# Patient Record
Sex: Female | Born: 1960 | Race: White | Hispanic: No | Marital: Married | Smoking: Current every day smoker
Health system: Southern US, Community
[De-identification: ages and names within clinical notes are randomized; demographics above are authoritative.]

## PROBLEM LIST (undated history)

## (undated) DIAGNOSIS — R188 Other ascites: Secondary | ICD-10-CM

## (undated) DIAGNOSIS — M47817 Spondylosis without myelopathy or radiculopathy, lumbosacral region: Secondary | ICD-10-CM

## (undated) DIAGNOSIS — R569 Unspecified convulsions: Secondary | ICD-10-CM

## (undated) DIAGNOSIS — K769 Liver disease, unspecified: Secondary | ICD-10-CM

## (undated) DIAGNOSIS — E039 Hypothyroidism, unspecified: Secondary | ICD-10-CM

## (undated) DIAGNOSIS — K7030 Alcoholic cirrhosis of liver without ascites: Secondary | ICD-10-CM

## (undated) DIAGNOSIS — E7800 Pure hypercholesterolemia, unspecified: Secondary | ICD-10-CM

## (undated) DIAGNOSIS — S329XXA Fracture of unspecified parts of lumbosacral spine and pelvis, initial encounter for closed fracture: Secondary | ICD-10-CM

## (undated) DIAGNOSIS — I10 Essential (primary) hypertension: Secondary | ICD-10-CM

## (undated) DIAGNOSIS — I499 Cardiac arrhythmia, unspecified: Secondary | ICD-10-CM

## (undated) DIAGNOSIS — F418 Other specified anxiety disorders: Secondary | ICD-10-CM

## (undated) DIAGNOSIS — M47812 Spondylosis without myelopathy or radiculopathy, cervical region: Secondary | ICD-10-CM

## (undated) DIAGNOSIS — S82892A Other fracture of left lower leg, initial encounter for closed fracture: Secondary | ICD-10-CM

## (undated) DIAGNOSIS — M1990 Unspecified osteoarthritis, unspecified site: Secondary | ICD-10-CM

## (undated) DIAGNOSIS — E559 Vitamin D deficiency, unspecified: Secondary | ICD-10-CM

## (undated) HISTORY — DX: Alcoholic cirrhosis of liver without ascites: K70.30

## (undated) HISTORY — DX: Other ascites: R18.8

## (undated) HISTORY — DX: Other fracture of left lower leg, initial encounter for closed fracture: S82.892A

## (undated) HISTORY — DX: Vitamin D deficiency, unspecified: E55.9

## (undated) HISTORY — PX: CERVICAL SPINE SURGERY: SHX589

## (undated) HISTORY — DX: Unspecified osteoarthritis, unspecified site: M19.90

## (undated) HISTORY — PX: GALLBLADDER SURGERY: SHX652

## (undated) HISTORY — DX: Hypothyroidism, unspecified: E03.9

## (undated) HISTORY — DX: Spondylosis without myelopathy or radiculopathy, cervical region: M47.812

## (undated) HISTORY — DX: Fracture of unspecified parts of lumbosacral spine and pelvis, initial encounter for closed fracture: S32.9XXA

## (undated) HISTORY — DX: Other specified anxiety disorders: F41.8

## (undated) HISTORY — PX: REPLACEMENT TOTAL KNEE: SUR1224

## (undated) HISTORY — DX: Cardiac arrhythmia, unspecified: I49.9

## (undated) HISTORY — DX: Spondylosis without myelopathy or radiculopathy, lumbosacral region: M47.817

## (undated) HISTORY — DX: Liver disease, unspecified: K76.9

## (undated) HISTORY — DX: Unspecified convulsions: R56.9

## (undated) HISTORY — PX: WRIST SURGERY: SHX841

## (undated) HISTORY — DX: Pure hypercholesterolemia, unspecified: E78.00

## (undated) HISTORY — DX: Essential (primary) hypertension: I10

---

## 2002-09-28 ENCOUNTER — Inpatient Hospital Stay (HOSPITAL_COMMUNITY): Admission: EM | Admit: 2002-09-28 | Discharge: 2002-09-30 | Payer: Self-pay | Admitting: Psychiatry

## 2006-05-14 ENCOUNTER — Encounter: Admission: RE | Admit: 2006-05-14 | Discharge: 2006-05-14 | Payer: Self-pay | Admitting: Neurosurgery

## 2006-06-12 ENCOUNTER — Inpatient Hospital Stay (HOSPITAL_COMMUNITY): Admission: AD | Admit: 2006-06-12 | Discharge: 2006-06-16 | Payer: Self-pay | Admitting: Neurosurgery

## 2007-04-22 HISTORY — PX: LUMBAR SPINE SURGERY: SHX701

## 2010-05-15 ENCOUNTER — Ambulatory Visit (HOSPITAL_COMMUNITY)
Admission: RE | Admit: 2010-05-15 | Discharge: 2010-05-15 | Payer: Self-pay | Source: Home / Self Care | Attending: Neurosurgery | Admitting: Neurosurgery

## 2010-05-15 LAB — CBC
HCT: 44 % (ref 36.0–46.0)
Hemoglobin: 15 g/dL (ref 12.0–15.0)
MCH: 36.7 pg — ABNORMAL HIGH (ref 26.0–34.0)
MCHC: 34.1 g/dL (ref 30.0–36.0)
MCV: 107.6 fL — ABNORMAL HIGH (ref 78.0–100.0)
Platelets: 162 10*3/uL (ref 150–400)
RBC: 4.09 MIL/uL (ref 3.87–5.11)
RDW: 12.3 % (ref 11.5–15.5)
WBC: 4.7 10*3/uL (ref 4.0–10.5)

## 2010-05-15 LAB — BASIC METABOLIC PANEL
BUN: 3 mg/dL — ABNORMAL LOW (ref 6–23)
CO2: 29 mEq/L (ref 19–32)
Calcium: 9.7 mg/dL (ref 8.4–10.5)
Chloride: 100 mEq/L (ref 96–112)
Creatinine, Ser: 0.49 mg/dL (ref 0.4–1.2)
GFR calc Af Amer: >60 mL/min (ref >60)
GFR calc non Af Amer: >60 mL/min (ref >60)
Glucose, Bld: 80 mg/dL (ref 70–99)
Potassium: 4.2 mEq/L (ref 3.5–5.1)
Sodium: 139 mEq/L (ref 135–145)

## 2010-05-15 LAB — SURGICAL PCR SCREEN
MRSA, PCR: NEGATIVE
Staphylococcus aureus: NEGATIVE

## 2010-05-24 ENCOUNTER — Inpatient Hospital Stay (HOSPITAL_COMMUNITY)
Admission: RE | Admit: 2010-05-24 | Discharge: 2010-05-26 | DRG: 473 | Disposition: A | Discharge status: Home / Self Care | Payer: Medicaid Other | Source: Ambulatory Visit | Attending: Neurosurgery | Admitting: Neurosurgery

## 2010-05-24 ENCOUNTER — Inpatient Hospital Stay (HOSPITAL_COMMUNITY): Payer: Medicaid Other

## 2010-05-24 DIAGNOSIS — E039 Hypothyroidism, unspecified: Secondary | ICD-10-CM | POA: Diagnosis present

## 2010-05-24 DIAGNOSIS — M5030 Other cervical disc degeneration, unspecified cervical region: Principal | ICD-10-CM | POA: Diagnosis present

## 2010-05-24 DIAGNOSIS — Z880 Allergy status to penicillin: Secondary | ICD-10-CM

## 2010-05-24 DIAGNOSIS — F17200 Nicotine dependence, unspecified, uncomplicated: Secondary | ICD-10-CM | POA: Diagnosis present

## 2010-05-24 DIAGNOSIS — I10 Essential (primary) hypertension: Secondary | ICD-10-CM | POA: Diagnosis present

## 2010-05-24 DIAGNOSIS — Z8673 Personal history of transient ischemic attack (TIA), and cerebral infarction without residual deficits: Secondary | ICD-10-CM

## 2010-06-04 NOTE — Op Note (Signed)
  NAMEANGELEA, Ann Wall             ACCOUNT NO.:  [id number]  MEDICAL RECORD NO.:  [id number]           PATIENT TYPE:  I  LOCATION:  3007                         FACILITY:  [facility]  PHYSICIAN:  Hilda Lias, M.D.   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  PROCEDURES:  Anterior 4-5, 5-6 diskectomy, decompression of the cervical spinal cord, bilateral foraminotomy, interbody fusion with auto and allograft, plate, microscope.  SURGEON:  Hilda Lias, MD  ASSISTANT:  Hewitt Shorts, MD  CLINICAL HISTORY:  The patient was admitted because of neck pain with radiation to both upper extremities that had been going on for several months.  The pain is worse on the left side than the right side.  She also has some pain in the lower back going to the lower extremity.  X- rays showed stenosis and spondylosis at the level of 4-5 and 5-6.  The patient knew about the risks with surgery and also knew that the surgery will not correct the lower lumbar pain.  PROCEDURE:  The patient was taken to the OR and after intubation the left side of the neck was cleaned with DuraPrep.  Transverse incision was made through the skin and subcutaneous tissue down to the cervical spine.  X-ray showed that we were at the level of 5-6.  From then on the anterior ligament of 4-5 and 5-6 was opened and we brought the microscope into the area.  Indeed the disk between 5-6 was quite narrow all the way to the posterior ligament.  The posterior ligament was calcified, opening was done in the midline, and decompression of spinal cord and foraminotomy was accomplished.  The same procedure was done at L4-5 with the same  findings.  From then on the endplate of 4-5 and 5-6 were removed and two piece of allograft with autograft inside, 7 mm height, lordotic were introduced followed by a plate using 6 screws. Lateral cervical spine showed good position of bone graft and the plate. From then on, the area was irrigated.  We waited 5 minutes just to be sure that we had good hemostasis.  Once this was accomplished, the wound was closed with Vicryl and Steri-Strips.          ______________________________ Hilda Lias, M.D.     EB/MEDQ  D:  05/24/2010  T:  05/25/2010  Job:  [id number]  Electronically Signed by Hilda Lias M.D. on 05/29/2010 09:41:17 AM

## 2010-09-06 NOTE — Op Note (Signed)
NAME:  Ann Wall, Ann Wall NO.:  [id number]   MEDICAL RECORD NO.:  [id number]          PATIENT TYPE:  INP   LOCATION:  3011                         FACILITY:  [facility]   PHYSICIAN:  Hilda Lias, M.D.   DATE OF BIRTH:  Sep 06, 1960   DATE OF PROCEDURE:  06/12/2006  DATE OF DISCHARGE:                               OPERATIVE REPORT   PREOPERATIVE DIAGNOSES:  1. L4-5 spondylolisthesis with chronic radiculopathy.  2. Left L3-4 extraforaminal herniated disk.   POSTOPERATIVE DIAGNOSES:  1. L4-L5 spondylolisthesis with chronic radiculopathy.  2. Left L3-L4 extraforaminal herniated disk.   PROCEDURE:  1. Gill L4 laminectomy and facetectomy.  2. Bilateral total diskectomy.  3. Interbody fusion with cages, 10 x 22.  4. Pedicle screws L4-L5.  5. Posterolateral arthrodesis L4-L5 with autograft and BMP.  6. L3-L4 extraforaminal foraminotomy to decompress the L3 nerve root.  7. Microscope.   SURGEON:  Hilda Lias, MD   CLINICAL HISTORY:  The patient is being seen in my office because of  chronic back pain.  The patient has failed conservative treatment.  X-  rays show spondylosis at the level of L4-L5 with stenosis.  Also showed  that she has extraforaminal disk of L3-L4 to the left.  Surgery was  advised and the risks were explained during the history and physical.   PROCEDURE IN DETAIL:  The patient was taken to the operating room and  after intubation for anesthesia was put on monitor.  The back was  cleaned with DuraPrep.  A midline incision from L3 to L4-5 was made.  Muscle was retracted laterally all the way until we were able to  __________  4, 5, and 3.  Retractor was inserted.  Then we proceeded  with the Gill procedure removing the spinous process of L4, the lamina,  and the facet.  The patient had quite a bit of scar tissue and lysis was  accomplished.  We had to drill out part of the medial aspect of the  canal.  Finally, we were able to reach into the disk.   An incision was  made to the disk and total gross diskectomy using a curette was done.  This procedure was done bilaterally.  Having done total diskectomy and  shaving the end plate, we introduced cages of 10 x 22 with BMP and  autograft inside.  The rest of the disk was filled up with the same BMP  and autograft.  Using the C-arm, first in an AP view and then a lateral  view, we probed the pedicles of L4-L5.  __________  were introduced and  __________  were introduced for screws.  At the level of L4-L5, the  screws were 5 x 45 and at the level of L5 were 5.5 x 40.  Investigation  __________  on the pedicle were normal with no perforation.  Nerve root  L4-L5 were completely intact.  Having done this, the pedicle screw was  secured in place using a rod with caps on top.  Then we went laterally  and __________ the periosteum of the transverse process of L4-L5, as  well as, the  right aspect of the facet.  This procedure was done  bilaterally.  A mix of BMP and autograft was done to do the arthrodesis.  Then with the microscope we identified L3-4 extraforaminal.  The  superior part of the facet laterally was drilled, as well as, the  proximal transverse process of the L3.  The intertransverse ligament was  also excised, as well as, the muscle.  We found the L3 nerve root.  It  was normal.  We investigated the foramen, it was found to be tight but  there was no evidence of any herniated disk.  Foraminotomy was  accomplished __________.  The disk was intact.  From then on, we  introduced a probe and there was plenty of space for the L3 nerve root.  Then the area was irrigated.  Fentanyl was left in the epidural space  and the wound was closed with Vicryl and Steri-Strip.           ______________________________  Hilda Lias, M.D.     EB/MEDQ  D:  06/12/2006  T:  06/13/2006  Job:  [id number]
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
## 2010-09-06 NOTE — Discharge Summary (Signed)
NAME:  Ann Wall, Ann Wall NO.:  [id number]   MEDICAL RECORD NO.:  [id number]                   PATIENT TYPE:  IPS   LOCATION:  0407                                 FACILITY:  [facility]   PHYSICIAN:  Jeanice Lim, M.D.              DATE OF BIRTH:  1961/02/23   DATE OF ADMISSION:  09/28/2002  DATE OF DISCHARGE:  09/30/2002                                 DISCHARGE SUMMARY   IDENTIFYING DATA:  This is a 50 year old single Caucasian female  involuntarily committed presenting with severe anxiety and alcohol abuse,  shaking and jerking at home with some thought-blocking, using alcohol and  cocaine.   MEDICATIONS:  Prozac and Ambien.   ALLERGIES:  PENICILLIN.   PHYSICAL EXAMINATION:  Essentially within normal limits.  Neurologically  nonfocal.   LABORATORY DATA:  Routine admission labs within normal limits except  prolonged QT at 44.  CT negative.  Urine pregnancy test negative.   MENTAL STATUS EXAM:  Awake, alert, cooperative female.  Soft-spoken.  Mood  somewhat bitter.  Affect flat.  Thought processes goal directed.  Thought  content negative for dangerous ideation or psychotic symptoms.  Cognitively  intact.  Judgment and insight poor.   ADMISSION DIAGNOSES:   AXIS I:  1. Psychosis not otherwise specified.  2. Alcohol abuse.  3. Cocaine abuse.   AXIS II:  Deferred.   AXIS III:  1. Cirrhosis per history.  2. Current urinary tract infection.   AXIS IV:  Moderate (problems with primary support group).   AXIS V:  25/60.   HOSPITAL COURSE:  The patient was admitted and ordered routine p.r.n.  medications and underwent further monitoring.  Was encouraged to  participated in individual, group and milieu therapy.  The patient reported  being very depressed and had a seizure prior to admission, as per patient.  History of liver cirrhosis, as per patient.  The patient denied any  withdrawal symptoms on day #2.  Reported no confusion.  Aware of the  danger  of drinking and using cocaine.  The patient did not require p.r.n.  medications for detox and had no acute risk issues.   CONDITION ON DISCHARGE:  She was discharged in improved condition.   DISCHARGE MEDICATIONS:  1. Prozac 20 mg, 2 q.a.m.  2. Keflex 500 mg t.i.d.  3. Librium 25 mg q.h.s. x 2 days.   FOLLOW UP:  The patient was to follow up with [facility] on October 05, 2002 at 9 a.m.   DISCHARGE DIAGNOSES:   AXIS I:  1. Psychosis not otherwise specified.  2. Alcohol abuse.  3. Cocaine abuse.   AXIS II:  Deferred.   AXIS III:  1. Cirrhosis per history.  2. Current urinary tract infection.   AXIS IV:  Moderate (problems with primary support group).   AXIS V:  Global Assessment of Functioning on discharge 55.  Jeanice Lim, M.D.    JEM/MEDQ  D:  11/02/2002  T:  11/03/2002  Job:  [id number]

## 2012-08-30 ENCOUNTER — Ambulatory Visit (INDEPENDENT_AMBULATORY_CARE_PROVIDER_SITE_OTHER): Payer: BC Managed Care – PPO | Admitting: Neurology

## 2012-08-30 ENCOUNTER — Encounter: Payer: Self-pay | Admitting: Neurology

## 2012-08-30 VITALS — BP 121/84 | HR 82 | Ht 62.5 in | Wt 122.0 lb

## 2012-08-30 DIAGNOSIS — M47812 Spondylosis without myelopathy or radiculopathy, cervical region: Secondary | ICD-10-CM

## 2012-08-30 DIAGNOSIS — M79609 Pain in unspecified limb: Secondary | ICD-10-CM

## 2012-08-30 DIAGNOSIS — M47817 Spondylosis without myelopathy or radiculopathy, lumbosacral region: Secondary | ICD-10-CM

## 2012-08-30 HISTORY — DX: Spondylosis without myelopathy or radiculopathy, cervical region: M47.812

## 2012-08-30 HISTORY — DX: Spondylosis without myelopathy or radiculopathy, lumbosacral region: M47.817

## 2012-08-30 NOTE — Progress Notes (Signed)
Reason for visit: Neck and back pain  Ann Wall is a 52 y.o. female  History of present illness:  Ann Wall is a 52 year old left-handed white female with a history of chronic neck and low back pain. The patient has had lumbar and cervical spine surgery in the past. The patient has been seen previously through this office in 2008 for neck and low back pain. The patient indicates that she has discomfort involving the neck, with some headaches coming up from the back of the neck that occurred 3 or 4 times a week. The patient denies any pain down into the shoulders or arms from the neck. The patient also has chronic low back pain, and pain radiating down from the back into the legs down to the feet bilaterally. The patient also experiences arthritis associated pain in both knees, and she indicates that she has a Baker's cyst in the left knee. The patient is to see an orthopedic surgeon within the next several days. The patient feels as if the legs are weak, and she has some alteration in balance. The patient uses a cane for ambulation. The patient denies problems controlling the bowels or the bladder. The patient has been on chronic daily narcotic use for several years, taking 4 to 7 oxycodone 30 mg tablets daily. The patient reports some numbness involving the right hand, but no numbness otherwise in the arms. The patient has had MRI evaluations of the cervical spine done recently in October 2013, and the study is brought for my review. This study shows a shallow disc protrusion at the C6-7 level, but no evidence of nerve root impingement is seen. The patient has had prior surgery at the C4-C6 level. MRI of the lumbosacral spine has been done previously showing a chronic vertebral body fracture at L3 level, with some bone marrow edema. Otherwise, no significant neuroforaminal stenosis or spinal stenosis is seen at any level. MRI of the brain has been done as well in June of 2012, and this revealed  mild small vessel disease. The patient is sent to this office for an evaluation.  Past Medical History  Diagnosis Date  . Hypertension   . High cholesterol   . Depression with anxiety   . Cervical spondylosis without myelopathy 08/30/2012  . Lumbosacral spondylosis without myelopathy 08/30/2012  . Vitamin D deficiency   . Hypothyroidism   . Degenerative arthritis   . Pelvic fracture     Bilateral  . Ankle fracture, left     Past Surgical History  Procedure Laterality Date  . Lumbar spine surgery  2009  . Cervical spine surgery    . Cesarean section  [phone]  . Wrist surgery Right     Family History  Problem Relation Age of Onset  . Hypertension Mother   . High Cholesterol Mother   . Hypertension Father   . Diabetes Father   . High Cholesterol Father   . Stroke Father   . Cancer Maternal Aunt     Breast cancer  . Hypertension Paternal Aunt   . Stroke Paternal Aunt   . Hypertension Paternal Uncle   . Stroke Paternal Uncle     Social history:  reports that she has been smoking Cigarettes.  She has been smoking about 0.50 packs per day. She does not have any smokeless tobacco history on file. She reports that she does not drink alcohol or use illicit drugs.  Medications:  No current outpatient prescriptions on file prior to visit.  No current facility-administered medications on file prior to visit.    Allergies:  Allergies  Allergen Reactions  . Penicillins     ROS:  Out of a complete 14 system review of symptoms, the patient complains only of the following symptoms, and all other reviewed systems are negative.  Fevers, chills, fatigue Palpitations of the heart, swelling of the legs Dizziness, difficulty swallowing Moles Blurred vision, eye pain Shortness of breath, cough, wheezing, snoring Constipation Easy bruising easy bleeding Joint pain, joint swelling, muscle cramps Allergies, runny nose Memory loss, confusion, headache, numbness, slurred  speech, difficulty swallowing, blackout episodes Depression, anxiety, difficulty sleeping, change in appetite, disinterest in activities, racing thoughts  Blood pressure 121/84, pulse 82, height 5' 2.5" (1.588 m), weight 122 lb (55.339 kg).  Physical Exam  General: The patient is alert and cooperative at the time of the examination.  Head: Pupils are equal, round, and reactive to light. Discs are flat bilaterally.  Neck: The neck is supple, no carotid bruits are noted.  Respiratory: The respiratory examination is clear, with the exception of very occasional wheezes posteriorly.  Cardiovascular: The cardiovascular examination reveals a regular rate and rhythm, no obvious murmurs or rubs are noted.  Skin: Extremities are without significant edema.  Neurologic Exam  Mental status:  Cranial nerves: Facial symmetry is present. There is good sensation of the face to pinprick and soft touch bilaterally. The strength of the facial muscles and the muscles to head turning and shoulder shrug are normal bilaterally. Speech is well enunciated, no aphasia or dysarthria is noted. Extraocular movements are full. Visual fields are full.  Motor: The motor testing reveals 5 over 5 strength of the upper extremities. With the lower extremities, the patient has diffuse giveaway weakness throughout. Good symmetric motor tone is noted throughout.  Sensory: Sensory testing is notable for variable pinprick sensation testing in the arms and legs. The patient appears to have decreased position and vibration sensation in the lower extremities, but no stocking pattern pinprick sensory deficit is seen. Position sensation and vibration sensation in arms is normal. Evidence of extinction is noted on the right.  Coordination: Cerebellar testing reveals good finger-nose-finger and heel-to-shin bilaterally.  Gait and station: Gait is slow, limping in quality. The patient uses a cane for ambulation. Tandem gait was  unsteady. Romberg is negative. No drift is seen.  Reflexes: Deep tendon reflexes are symmetric, but are depressed bilaterally. Toes are downgoing bilaterally.   Assessment/Plan:  1. Chronic neck discomfort  2. Chronic low back pain  3. Degenerative arthritis  The patient presents with a chronic pain syndrome, and she is on daily narcotic medications. In the past, she has been on Lyrica without benefit. The patient likely could use the benefit of a pain center referral for possible injections and the consideration for a spinal stimulator. The patient will be set up for nerve conduction studies of both arms and legs, and EMG evaluation of the right arm and right leg. Depending on the results of this study, further blood work may be done, and a referral to a pain center may be made. The patient will followup for the EMG evaluation.  Marlan Palau MD 08/30/2012 8:23 PM  [facility] [address] [place], [place] [address]  Phone [phone] Fax [phone]

## 2012-09-15 ENCOUNTER — Encounter: Payer: Self-pay | Admitting: Neurology

## 2012-09-22 ENCOUNTER — Encounter: Payer: BC Managed Care – PPO | Admitting: Neurology

## 2012-09-28 ENCOUNTER — Encounter: Payer: BC Managed Care – PPO | Admitting: Neurology

## 2012-10-14 ENCOUNTER — Telehealth: Payer: Self-pay | Admitting: Neurology

## 2012-10-19 ENCOUNTER — Encounter: Payer: BC Managed Care – PPO | Admitting: Neurology

## 2012-11-15 ENCOUNTER — Encounter (INDEPENDENT_AMBULATORY_CARE_PROVIDER_SITE_OTHER): Payer: BC Managed Care – PPO

## 2012-11-15 ENCOUNTER — Telehealth: Payer: Self-pay | Admitting: Neurology

## 2012-11-15 ENCOUNTER — Ambulatory Visit (INDEPENDENT_AMBULATORY_CARE_PROVIDER_SITE_OTHER): Payer: BC Managed Care – PPO | Admitting: Neurology

## 2012-11-15 DIAGNOSIS — M47812 Spondylosis without myelopathy or radiculopathy, cervical region: Secondary | ICD-10-CM

## 2012-11-15 DIAGNOSIS — M47817 Spondylosis without myelopathy or radiculopathy, lumbosacral region: Secondary | ICD-10-CM

## 2012-11-15 DIAGNOSIS — M79609 Pain in unspecified limb: Secondary | ICD-10-CM

## 2012-11-15 DIAGNOSIS — Z0289 Encounter for other administrative examinations: Secondary | ICD-10-CM

## 2012-11-15 NOTE — Progress Notes (Signed)
Ann Wall comes in today for EMG and nerve conduction study evaluation. The study was unremarkable. The patient continues to have ongoing chronic pain, and she indicates that she is getting pain medications through her primary care physician. The patient is not interested in a referral to a pain Center, and she is not interested in considering a spinal stimulator for her chronic pain. There is no evidence of nerve root injury, but the pain likely is coming from arthritic changes, and muscle spasm. At this point, the patient will followup through this office if needed.

## 2012-11-15 NOTE — Procedures (Signed)
HISTORY:  Ann Wall is a 52 year old patient with a history of chronic neck and low back pain. The patient has had prior lumbosacral and cervical spine surgery, and she reports ongoing problems with cervicogenic headache and chronic low back pain with pain coming from the back down to the feet bilaterally. The patient is being evaluated for the discomfort.  NERVE CONDUCTION STUDIES:  Nerve conduction studies were performed on both upper extremities. The distal motor latencies and motor amplitudes for the median and ulnar nerves were within normal limits. The F wave latencies and nerve conduction velocities for these nerves were also normal. The sensory latencies for the median and ulnar nerves were normal.  Nerve conduction studies were performed on both lower extremities. The distal motor latencies and motor amplitudes for the peroneal and posterior tibial nerves were within normal limits. The nerve conduction velocities for these nerves were also normal. The H reflex latencies were normal. The sensory latencies for the peroneal nerves were within normal limits.   EMG STUDIES:  EMG study was performed on the right upper extremity:  The first dorsal interosseous muscle reveals 2 to 4 K units with full recruitment. No fibrillations or positive waves were noted. The abductor pollicis brevis muscle reveals 2 to 4 K units with full recruitment. No fibrillations or positive waves were noted. The extensor indicis proprius muscle reveals 1 to 3 K units with full recruitment. No fibrillations or positive waves were noted. The pronator teres muscle reveals 2 to 3 K units with full recruitment. No fibrillations or positive waves were noted. The biceps muscle reveals 1 to 2 K units with full recruitment. No fibrillations or positive waves were noted. The triceps muscle reveals 2 to 4 K units with full recruitment. No fibrillations or positive waves were noted. The anterior deltoid muscle reveals 2  to 3 K units with full recruitment. No fibrillations or positive waves were noted. The cervical paraspinal muscles were tested at 2 levels. No abnormalities of insertional activity were seen at either level tested. There was poor relaxation.  EMG study was performed on the right lower extremity:  The tibialis anterior muscle reveals 2 to 3K motor units with full recruitment. No fibrillations or positive waves were seen. The peroneus tertius muscle reveals 2 to 3K motor units with full recruitment. No fibrillations or positive waves were seen. The medial gastrocnemius muscle reveals 1 to 3K motor units with full recruitment. No fibrillations or positive waves were seen. The vastus lateralis muscle reveals 2 to 3K motor units with full recruitment. No fibrillations or positive waves were seen. The iliopsoas muscle reveals 2 to 4K motor units with full recruitment. No fibrillations or positive waves were seen. The biceps femoris muscle (long head) reveals 2 to 4K motor units with full recruitment. No fibrillations or positive waves were seen. The lumbosacral paraspinal muscles were tested at 3 levels, and revealed no abnormalities of insertional activity at the upper and middle levels tested. 2+ fibrillations and positive waves were seen in the lower level. There was good relaxation.   IMPRESSION:  Nerve conduction studies done on all 4 extremities were within normal limits. No evidence of a neuropathy is seen. EMG evaluation of the right upper extremity was unremarkable, as was the EMG evaluation of the right lower extremity. There is no evidence of a right sided cervical or a lumbosacral radiculopathy.  Marlan Palau MD 11/15/2012 1:54 PM  [facility] [address] [place], [place] [address]  Phone [phone]  Fax [phone]

## 2012-11-15 NOTE — Telephone Encounter (Signed)
I called the patient. The patient went information regarding a spinal stimulator. I have recommended a pain Center referral for this reason. The patient will contact our office if she wants to have a referral.

## 2013-06-29 NOTE — Telephone Encounter (Signed)
Pt came in for her visit closing encounter °

## 2017-08-07 DIAGNOSIS — T424X5A Adverse effect of benzodiazepines, initial encounter: Secondary | ICD-10-CM

## 2017-08-07 DIAGNOSIS — F419 Anxiety disorder, unspecified: Secondary | ICD-10-CM

## 2017-08-07 DIAGNOSIS — E876 Hypokalemia: Secondary | ICD-10-CM

## 2017-08-07 DIAGNOSIS — R569 Unspecified convulsions: Secondary | ICD-10-CM

## 2017-08-07 DIAGNOSIS — E860 Dehydration: Secondary | ICD-10-CM

## 2017-08-07 DIAGNOSIS — F17210 Nicotine dependence, cigarettes, uncomplicated: Secondary | ICD-10-CM

## 2017-08-07 DIAGNOSIS — G92 Toxic encephalopathy: Secondary | ICD-10-CM

## 2017-08-07 DIAGNOSIS — E039 Hypothyroidism, unspecified: Secondary | ICD-10-CM

## 2017-08-07 DIAGNOSIS — T40605A Adverse effect of unspecified narcotics, initial encounter: Secondary | ICD-10-CM

## 2017-08-08 DIAGNOSIS — T424X5A Adverse effect of benzodiazepines, initial encounter: Secondary | ICD-10-CM | POA: Diagnosis not present

## 2017-08-08 DIAGNOSIS — T40605A Adverse effect of unspecified narcotics, initial encounter: Secondary | ICD-10-CM | POA: Diagnosis not present

## 2017-08-08 DIAGNOSIS — R569 Unspecified convulsions: Secondary | ICD-10-CM | POA: Diagnosis not present

## 2017-08-08 DIAGNOSIS — G92 Toxic encephalopathy: Secondary | ICD-10-CM | POA: Diagnosis not present

## 2017-08-28 DIAGNOSIS — K7031 Alcoholic cirrhosis of liver with ascites: Secondary | ICD-10-CM | POA: Diagnosis not present

## 2017-08-28 DIAGNOSIS — R569 Unspecified convulsions: Secondary | ICD-10-CM | POA: Diagnosis not present

## 2017-08-29 DIAGNOSIS — R569 Unspecified convulsions: Secondary | ICD-10-CM

## 2017-08-29 DIAGNOSIS — K7031 Alcoholic cirrhosis of liver with ascites: Secondary | ICD-10-CM

## 2017-08-30 DIAGNOSIS — R569 Unspecified convulsions: Secondary | ICD-10-CM | POA: Diagnosis not present

## 2017-08-30 DIAGNOSIS — K7031 Alcoholic cirrhosis of liver with ascites: Secondary | ICD-10-CM | POA: Diagnosis not present

## 2018-03-31 DIAGNOSIS — E440 Moderate protein-calorie malnutrition: Secondary | ICD-10-CM

## 2018-03-31 DIAGNOSIS — K7460 Unspecified cirrhosis of liver: Secondary | ICD-10-CM

## 2018-03-31 DIAGNOSIS — G92 Toxic encephalopathy: Secondary | ICD-10-CM

## 2018-03-31 DIAGNOSIS — F19931 Other psychoactive substance use, unspecified with withdrawal delirium: Secondary | ICD-10-CM | POA: Diagnosis not present

## 2018-03-31 DIAGNOSIS — G9341 Metabolic encephalopathy: Secondary | ICD-10-CM | POA: Diagnosis not present

## 2018-03-31 DIAGNOSIS — R4182 Altered mental status, unspecified: Secondary | ICD-10-CM | POA: Diagnosis not present

## 2018-03-31 DIAGNOSIS — N3000 Acute cystitis without hematuria: Secondary | ICD-10-CM

## 2018-03-31 DIAGNOSIS — F19188 Other psychoactive substance abuse with other psychoactive substance-induced disorder: Secondary | ICD-10-CM | POA: Diagnosis not present

## 2018-03-31 DIAGNOSIS — I10 Essential (primary) hypertension: Secondary | ICD-10-CM

## 2018-04-01 DIAGNOSIS — F19931 Other psychoactive substance use, unspecified with withdrawal delirium: Secondary | ICD-10-CM | POA: Diagnosis not present

## 2018-04-01 DIAGNOSIS — F19188 Other psychoactive substance abuse with other psychoactive substance-induced disorder: Secondary | ICD-10-CM | POA: Diagnosis not present

## 2018-04-01 DIAGNOSIS — G9341 Metabolic encephalopathy: Secondary | ICD-10-CM | POA: Diagnosis not present

## 2018-04-01 DIAGNOSIS — R4182 Altered mental status, unspecified: Secondary | ICD-10-CM | POA: Diagnosis not present

## 2018-04-02 DIAGNOSIS — R4182 Altered mental status, unspecified: Secondary | ICD-10-CM | POA: Diagnosis not present

## 2018-04-02 DIAGNOSIS — F19188 Other psychoactive substance abuse with other psychoactive substance-induced disorder: Secondary | ICD-10-CM | POA: Diagnosis not present

## 2018-04-02 DIAGNOSIS — G9341 Metabolic encephalopathy: Secondary | ICD-10-CM | POA: Diagnosis not present

## 2018-04-02 DIAGNOSIS — F19931 Other psychoactive substance use, unspecified with withdrawal delirium: Secondary | ICD-10-CM | POA: Diagnosis not present

## 2019-05-09 DIAGNOSIS — G40909 Epilepsy, unspecified, not intractable, without status epilepticus: Secondary | ICD-10-CM

## 2019-05-09 DIAGNOSIS — G9340 Encephalopathy, unspecified: Secondary | ICD-10-CM | POA: Diagnosis not present

## 2019-05-10 DIAGNOSIS — G9340 Encephalopathy, unspecified: Secondary | ICD-10-CM | POA: Diagnosis not present

## 2019-05-11 DIAGNOSIS — G9340 Encephalopathy, unspecified: Secondary | ICD-10-CM | POA: Diagnosis not present

## 2019-05-12 DIAGNOSIS — G9340 Encephalopathy, unspecified: Secondary | ICD-10-CM | POA: Diagnosis not present

## 2019-05-13 DIAGNOSIS — G9340 Encephalopathy, unspecified: Secondary | ICD-10-CM | POA: Diagnosis not present

## 2019-05-14 DIAGNOSIS — G9340 Encephalopathy, unspecified: Secondary | ICD-10-CM | POA: Diagnosis not present

## 2019-06-28 ENCOUNTER — Encounter: Payer: Self-pay | Admitting: Gastroenterology

## 2019-08-01 ENCOUNTER — Ambulatory Visit: Payer: Medicaid Other | Admitting: Gastroenterology

## 2019-08-25 ENCOUNTER — Ambulatory Visit: Payer: Medicaid Other | Admitting: Gastroenterology

## 2019-08-25 ENCOUNTER — Other Ambulatory Visit: Payer: Self-pay

## 2019-08-25 ENCOUNTER — Encounter: Payer: Self-pay | Admitting: Gastroenterology

## 2019-08-25 VITALS — BP 98/60 | HR 70 | Temp 97.5°F | Ht 63.5 in | Wt 96.0 lb

## 2019-08-25 DIAGNOSIS — R109 Unspecified abdominal pain: Secondary | ICD-10-CM | POA: Diagnosis not present

## 2019-08-25 DIAGNOSIS — R188 Other ascites: Secondary | ICD-10-CM | POA: Diagnosis not present

## 2019-08-25 DIAGNOSIS — K7460 Unspecified cirrhosis of liver: Secondary | ICD-10-CM

## 2019-08-25 MED ORDER — SPIRONOLACTONE 50 MG PO TABS: 50.0000 mg | ORAL_TABLET | Freq: Every day | ORAL | 6 refills | Status: DC

## 2019-08-25 MED ORDER — LACTULOSE 10 GM/15ML PO SOLN: ORAL | 0 refills | Status: DC

## 2019-08-25 NOTE — Patient Instructions (Signed)
You have been scheduled for an abdominal paracentesis at  [facility] radiology (1st floor of hospital) on 08/29/19 at 10am. Please arrive at least 15 minutes prior to your appointment time for registration. Should you need to reschedule this appointment for any reason, please call our office at [phone].  Your provider has requested that you go to the basement level for lab work at [address] [place] in [place] [address]. Press "B" on the elevator. The lab is located at the first door on the left as you exit the elevator.  Due to recent changes in healthcare laws, you may see the results of your imaging and laboratory studies on MyChart before your provider has had a chance to review them.  We understand that in some cases there may be results that are confusing or concerning to you. Not all laboratory results come back in the same time frame and the provider may be waiting for multiple results in order to interpret others.  Please give [place] 48 hours in order for your provider to thoroughly review all the results before contacting the office for clarification of your results.   We have sent the following medications to your pharmacy for you to pick up at your convenience:   Thank you,  Dr. Lynann Bologna

## 2019-08-25 NOTE — Progress Notes (Addendum)
Chief Complaint: ascites  Referring Provider:  Bonnita Nasuti, MD      ASSESSMENT AND PLAN;   .#1. 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 Was being followed by Dr Melina Copa  #2.  Subclinical hepatic encephalopathy -lactulose 10 g per 15 mL.  1 tablespoon p.o. twice daily.  Titrate to 2-3 bowel movements per day.  #3.  Massive ascites -LVP. Send fluid for cytology, cell count, alb, culture -Check CBC, CMP, PT INR, AFP (monitor for electrolyte abnormalities and ARF) -Lasix 40 mg p.o. QD to continue.  Will gradually increase diuretics -Increased Spironolactone 50 mg p.o. QD -Salt restricted diet.  2g Na, normal protein diet.  #5. SZ D/O  #6. [place] screening  -US abdo annually. -Patient is not a candidate for liver transplantation d/t multiple comorbid conditions. -Discussed extensively with pt and pt's husband. They do understand poor long-term prognosis. -Avoid NSAIDs. -Obtain records from Dr. Carmie End office and [facility] regarding previous work-up. -FU in 6 weeks.  Addendum-we got notes from Dr Haque-significant for pos HBsAg (needs to be addressed at follow-up visit), neg HCV, HbcAb IgM. HAV IgM From 07/19/2019: Hb 13.3, MCV 84, WBC count 7.2, platelets 318. HBA1c 5.1 Iron studies were negative with ferritin 31 LFTs Albumin 3.2, alk phos 239, ALT 22, AST 45, creatinine 0.66. GGT 147, potassium 3.5, LDH 225, sodium 140, total bilirubin 1.0, magnesium 1.9.  Tox screen was positive for barbiturates and benzodiazepines. NH3 91 Rest of the labs, CT, discharge summary from [facility] awaited. HPI:    Ann Wall is a 59 y.o. female  First visit-seen as emergency workin.  Accompanied by her husband.  Records awaited. Very unfortunate patient With progressive abdominal distention-now has massive ascites And gained 10 pounds over the last 2 to 3 months. Has leg swelling as well.  Was been followed previously by Dr. Melina Copa.  Now  wants to be seen here.  Started on Lasix 40 mg p.o. once a day and spironolactone 25 mg p.o. once a day.  She continues to drink significant fluid.  She was admitted to [facility] approximately 3 months ago.  Had large-volume paracenteses.  Also had CT Abdo/pelvis.  We do not have records currently.  No nausea, vomiting, heartburn, regurgitation, odynophagia or dysphagia.  No significant diarrhea or constipation.  No melena or hematochezia.   She has history of alcohol abuse.  But none over the last 3 years.  No H/O itching, skin lesions, easy bruisability, intake of OTC meds including diet pills, herbal medications, anabolic steroids or Tylenol. There is no H/O blood transfusions, IVDA or FH of liver disease. No jaundice, dark urine or pale stools.   Had previous work-up with Dr. Melina Copa     Past Medical History:  Diagnosis Date  . Alcoholic cirrhosis ([place])   . Ankle fracture, left   . Arrhythmia   . Ascites   . Cervical spondylosis without myelopathy 08/30/2012  . Chronic liver disease   . Degenerative arthritis   . Depression with anxiety   . High cholesterol   . Hypertension   . Hypothyroidism   . Lumbosacral spondylosis without myelopathy 08/30/2012  . Pelvic fracture ([facility])    Bilateral  . Seizure ([place])   . Vitamin D deficiency     Past Surgical History:  Procedure Laterality Date  . CERVICAL SPINE SURGERY    . CESAREAN SECTION  [phone]  . GALLBLADDER SURGERY    . LUMBAR SPINE SURGERY  2009  .  REPLACEMENT TOTAL KNEE     x2  . WRIST SURGERY Right     Family History  Problem Relation Age of Onset  . Hypertension Mother   . High Cholesterol Mother   . Hypertension Father   . Diabetes Father   . High Cholesterol Father   . Stroke Father   . Colon polyps Father   . Cancer Maternal Aunt        Breast cancer  . Hypertension Paternal Aunt   . Stroke Paternal Aunt   . Hypertension Paternal Uncle   . Stroke Paternal Uncle   . Colon cancer Neg Hx   . Esophageal  cancer Neg Hx     Social History   Tobacco Use  . Smoking status: Current Every Day Smoker    Packs/day: 0.50    Types: Cigarettes  . Smokeless tobacco: Never Used  Substance Use Topics  . Alcohol use: No    Comment: quit 3 years ago   . Drug use: No    Current Outpatient Medications  Medication Sig Dispense Refill  . furosemide (LASIX) 40 MG tablet Take 40 mg by mouth daily.    Marland Kitchen levETIRAcetam (KEPPRA) 750 MG tablet Take 750 mg by mouth 2 (two) times daily.    Marland Kitchen levothyroxine (SYNTHROID, LEVOTHROID) 25 MCG tablet Take 25 mcg by mouth daily before breakfast.    . metoprolol (LOPRESSOR) 50 MG tablet Take 50 mg by mouth 2 (two) times daily.    Marland Kitchen oxyCODONE (ROXICODONE) 15 MG immediate release tablet Take 15 mg by mouth QID.    Marland Kitchen POTASSIUM PO Take 1 tablet by mouth 2 (two) times daily.    . primidone (MYSOLINE) 50 MG tablet Take 100 mg by mouth daily.    Marland Kitchen spironolactone (ALDACTONE) 25 MG tablet Take 25 mg by mouth daily.    Marland Kitchen ALPRAZolam (XANAX) 1 MG tablet Take 1 mg by mouth 4 (four) times daily.    . cloNIDine (CATAPRES) 0.1 MG tablet Take 0.1 mg by mouth 2 (two) times daily.    . cyclobenzaprine (FLEXERIL) 10 MG tablet Take 10 mg by mouth 2 (two) times daily as needed for muscle spasms.      No current facility-administered medications for this visit.    Allergies  Allergen Reactions  . Penicillins     Review of Systems:  Constitutional: Denies fever, chills, diaphoresis, appetite change and fatigue.  HEENT: Denies photophobia, eye pain, redness, hearing loss, ear pain, congestion, sore throat, rhinorrhea, sneezing, mouth sores, neck pain, neck stiffness and tinnitus.   Respiratory: Denies SOB, DOE, cough, chest tightness,  and wheezing.   Cardiovascular: Denies chest pain, palpitations and leg swelling.  Genitourinary: Denies dysuria, urgency, frequency, hematuria, flank pain and difficulty urinating.  Musculoskeletal: Has myalgias, back pain, joint swelling, arthralgias  and gait problem.  Skin: No rash.  Neurological: Denies dizziness, seizures, syncope, weakness, light-headedness, numbness and headaches.  Hematological: Denies adenopathy. Easy bruising, personal or family bleeding history  Psychiatric/Behavioral: Has anxiety or depression     Physical Exam:    BP 98/60   Pulse 70   Temp (!) 97.5 F (36.4 C)   Ht 5' 3.5" (1.613 m)   Wt 96 lb (43.5 kg)   BMI 16.74 kg/m  Wt Readings from Last 3 Encounters:  08/25/19 96 lb (43.5 kg)  08/30/12 122 lb (55.3 kg)   Constitutional: Cachectic, in no acute distress. Psychiatric: Normal mood and affect. Behavior is normal. HEENT: Pupils normal.  Conjunctivae are normal. No scleral icterus.  Neck supple.  Cardiovascular: Normal rate, regular rhythm. No edema Pulmonary/chest: Bilateral decreased breath sounds, no wheezing, rales or rhonchi. Abdominal: Tense massive ascites with prominent abdominal veins.  Bowel sounds active throughout. There are no masses palpable. No hepatomegaly. Rectal:  defered Neurological: Alert and oriented to person place and time. Skin: Skin is warm and dry. No rashes noted.  Extremities with 2+ edema  Data Reviewed: I have personally reviewed following labs and imaging studies  CBC: CBC Latest Ref Rng & Units 05/15/2010  WBC 4.0 - 10.5 K/uL 4.7  Hemoglobin 12.0 - 15.0 g/dL 15.0  Hematocrit 36.0 - 46.0 % 44.0  Platelets 150 - 400 K/uL 162    CMP: CMP Latest Ref Rng & Units 05/15/2010  Glucose 70 - 99 mg/dL 80  BUN 6 - 23 mg/dL 3(L)  Creatinine 0.4 - 1.2 mg/dL 0.49  Sodium 135 - 145 mEq/L 139  Potassium 3.5 - 5.1 mEq/L 4.2  Chloride 96 - 112 mEq/L 100  CO2 19 - 32 mEq/L 29  Calcium 8.4 - 10.5 mg/dL 9.7     Carmell Austria, MD 08/25/2019, 10:38 AM  Cc: Bonnita Nasuti, MD

## 2019-08-29 ENCOUNTER — Other Ambulatory Visit: Payer: Self-pay

## 2019-08-29 ENCOUNTER — Ambulatory Visit (HOSPITAL_COMMUNITY)
Admission: RE | Admit: 2019-08-29 | Discharge: 2019-08-29 | Disposition: A | Discharge status: Home / Self Care | Payer: Medicaid Other | Source: Ambulatory Visit | Attending: Gastroenterology | Admitting: Gastroenterology

## 2019-08-29 DIAGNOSIS — R109 Unspecified abdominal pain: Secondary | ICD-10-CM

## 2019-08-29 DIAGNOSIS — R188 Other ascites: Secondary | ICD-10-CM

## 2019-08-29 DIAGNOSIS — K7460 Unspecified cirrhosis of liver: Secondary | ICD-10-CM | POA: Diagnosis not present

## 2019-08-29 HISTORY — PX: IR PARACENTESIS: IMG2679

## 2019-08-29 LAB — GRAM STAIN

## 2019-08-29 LAB — BODY FLUID CELL COUNT WITH DIFFERENTIAL
Eos, Fluid: 0 %
Lymphs, Fluid: 49 %
Monocyte-Macrophage-Serous Fluid: 31 % — ABNORMAL LOW (ref 50–90)
Neutrophil Count, Fluid: 20 % (ref 0–25)
Total Nucleated Cell Count, Fluid: 145 cu mm (ref 0–1000)

## 2019-08-29 LAB — ALBUMIN, PLEURAL OR PERITONEAL FLUID: Albumin, Fluid: <1 g/dL

## 2019-08-29 MED ORDER — LIDOCAINE HCL 1 % IJ SOLN
INTRAMUSCULAR | Status: AC
  Filled 2019-08-29: qty 20

## 2019-08-29 MED ORDER — ALBUMIN HUMAN 25 % IV SOLN
INTRAVENOUS | Status: AC
  Filled 2019-08-29: qty 200

## 2019-08-29 MED ORDER — LIDOCAINE HCL (PF) 1 % IJ SOLN
INTRAMUSCULAR | Status: AC | PRN
  Administered 2019-08-29: 10 mL

## 2019-08-29 MED ORDER — ALBUMIN HUMAN 25 % IV SOLN
50.0000 g | Freq: Once | INTRAVENOUS | Status: DC
  Filled 2019-08-29: qty 200

## 2019-08-29 NOTE — Progress Notes (Signed)
VAST consulted to obtain PIV for albumin administration. Attempted once before pt decided she did not want to be stuck anymore. Offered to use ultrasound, but she refused.

## 2019-08-29 NOTE — Procedures (Signed)
Ultrasound-guided diagnostic and therapeutic paracentesis performed yielding 5.3 liters of pale yelloe colored fluid.  Fluid was sent to lab for analysis. No immediate complications. EBL is none.

## 2019-08-30 LAB — CYTOLOGY - NON PAP

## 2019-09-03 LAB — CULTURE, BODY FLUID W GRAM STAIN -BOTTLE: Culture: NO GROWTH

## 2019-09-06 ENCOUNTER — Other Ambulatory Visit (INDEPENDENT_AMBULATORY_CARE_PROVIDER_SITE_OTHER): Payer: Medicaid Other

## 2019-09-06 DIAGNOSIS — K7460 Unspecified cirrhosis of liver: Secondary | ICD-10-CM

## 2019-09-06 DIAGNOSIS — R109 Unspecified abdominal pain: Secondary | ICD-10-CM

## 2019-09-06 DIAGNOSIS — R188 Other ascites: Secondary | ICD-10-CM

## 2019-09-06 LAB — CBC WITH DIFFERENTIAL/PLATELET
Basophils Absolute: 0.1 10*3/uL (ref 0.0–0.1)
Basophils Relative: 1.5 % (ref 0.0–3.0)
Eosinophils Absolute: 0.1 10*3/uL (ref 0.0–0.7)
Eosinophils Relative: 1.2 % (ref 0.0–5.0)
HCT: 37.3 % (ref 36.0–46.0)
Hemoglobin: 12.5 g/dL (ref 12.0–15.0)
Lymphocytes Relative: 21.9 % (ref 12.0–46.0)
Lymphs Abs: 1.1 10*3/uL (ref 0.7–4.0)
MCHC: 33.6 g/dL (ref 30.0–36.0)
MCV: 80.8 fl (ref 78.0–100.0)
Monocytes Absolute: 0.7 10*3/uL (ref 0.1–1.0)
Monocytes Relative: 13.4 % — ABNORMAL HIGH (ref 3.0–12.0)
Neutro Abs: 3.3 10*3/uL (ref 1.4–7.7)
Neutrophils Relative %: 62 % (ref 43.0–77.0)
Platelets: 293 10*3/uL (ref 150.0–400.0)
RBC: 4.62 Mil/uL (ref 3.87–5.11)
RDW: 17.8 % — ABNORMAL HIGH (ref 11.5–15.5)
WBC: 5.3 10*3/uL (ref 4.0–10.5)

## 2019-09-06 LAB — COMPREHENSIVE METABOLIC PANEL
ALT: 11 U/L (ref 0–35)
AST: 26 U/L (ref 0–37)
Albumin: 3.5 g/dL (ref 3.5–5.2)
Alkaline Phosphatase: 260 U/L — ABNORMAL HIGH (ref 39–117)
BUN: 8 mg/dL (ref 6–23)
CO2: 34 mEq/L — ABNORMAL HIGH (ref 19–32)
Calcium: 8.6 mg/dL (ref 8.4–10.5)
Chloride: 92 mEq/L — ABNORMAL LOW (ref 96–112)
Creatinine, Ser: 0.49 mg/dL (ref 0.40–1.20)
GFR: 129.13 mL/min (ref >60.00)
Glucose, Bld: 124 mg/dL — ABNORMAL HIGH (ref 70–99)
Potassium: 3.6 mEq/L (ref 3.5–5.1)
Sodium: 133 mEq/L — ABNORMAL LOW (ref 135–145)
Total Bilirubin: 0.9 mg/dL (ref 0.2–1.2)
Total Protein: 6.4 g/dL (ref 6.0–8.3)

## 2019-09-06 LAB — PROTIME-INR
INR: 1.2 ratio — ABNORMAL HIGH (ref 0.8–1.0)
Prothrombin Time: 13.4 s — ABNORMAL HIGH (ref 9.6–13.1)

## 2019-09-07 LAB — AFP TUMOR MARKER: AFP-Tumor Marker: 3.6 ng/mL

## 2019-09-08 ENCOUNTER — Encounter: Payer: Self-pay | Admitting: Gastroenterology

## 2019-09-08 ENCOUNTER — Other Ambulatory Visit: Payer: Self-pay

## 2019-09-08 ENCOUNTER — Other Ambulatory Visit: Payer: Self-pay | Admitting: Gastroenterology

## 2019-09-08 MED ORDER — LACTULOSE 10 GM/15ML PO SOLN: ORAL | 0 refills | Status: DC

## 2019-09-08 NOTE — Telephone Encounter (Signed)
Left message on voicemail that Dr Chales Abrahams stated at her last visit to come back in six weeks.  Appointment was made for June. Refill sent to pharmacy for 30 day supply only.

## 2019-09-08 NOTE — Telephone Encounter (Signed)
Patient called requesting refill om Lactulose and also would like to know what is the plan of care for her

## 2019-09-29 ENCOUNTER — Other Ambulatory Visit: Payer: Self-pay | Admitting: Gastroenterology

## 2019-10-10 ENCOUNTER — Ambulatory Visit: Payer: Medicaid Other | Admitting: Gastroenterology

## 2019-10-17 ENCOUNTER — Telehealth: Payer: Self-pay | Admitting: Gastroenterology

## 2019-10-17 NOTE — Telephone Encounter (Signed)
Pt's husband Inez Catalina would like to schedule paracentesis for pt, He states pt has accumulated a lot of fluid.

## 2019-10-18 ENCOUNTER — Telehealth: Payer: Self-pay | Admitting: Gastroenterology

## 2019-10-18 NOTE — Telephone Encounter (Signed)
Proceed with large-volume paracenteses (max 10 lit) If above 5 L, give 25 g of albumin IV If above 7 lit, give 50 g of albumin If fluid is turbid, send for cell count, culture, albumin, cytology  RG

## 2019-10-19 ENCOUNTER — Other Ambulatory Visit: Payer: Self-pay

## 2019-10-19 DIAGNOSIS — K7460 Unspecified cirrhosis of liver: Secondary | ICD-10-CM

## 2019-10-19 DIAGNOSIS — R188 Other ascites: Secondary | ICD-10-CM

## 2019-10-19 NOTE — Telephone Encounter (Signed)
Appointment for IR paracentesis at [facility] 10/20/19 at 11 am. Patient aware.

## 2019-10-20 ENCOUNTER — Encounter: Payer: Self-pay | Admitting: Gastroenterology

## 2019-11-07 ENCOUNTER — Other Ambulatory Visit: Payer: Self-pay | Admitting: Gastroenterology

## 2019-11-07 ENCOUNTER — Telehealth: Payer: Self-pay | Admitting: Gastroenterology

## 2019-11-07 DIAGNOSIS — K7460 Unspecified cirrhosis of liver: Secondary | ICD-10-CM

## 2019-11-07 DIAGNOSIS — R188 Other ascites: Secondary | ICD-10-CM

## 2019-11-07 NOTE — Telephone Encounter (Signed)
Please go ahead and schedule paracentesis at [facility] FU with me or Colleen thereafter (in 3-4 weeks) RG

## 2019-11-07 NOTE — Telephone Encounter (Signed)
Spoke to patient who complains of abdominal distention, and is requesting another paracentesis at [facility].(last on 10/20/19) Denies SOB feels "miserable " She had an appointment scheduled for 11/09/19 but called the office and cancelled. Please advise.

## 2019-11-07 NOTE — Telephone Encounter (Signed)
Hi Cathleen Fears from [facility] called you regarding this pt. She said that pt is scheduled for tomorrow at 1:00pm. Pt needs to arrive one hour before. Pls call Raynelle Fanning back at [phone], ext [phone].

## 2019-11-07 NOTE — Telephone Encounter (Signed)
Spoke to patient to inform her of paracentesis appointment at [facility]  to arrive at 12 pm. Patient voiced understanding. Raynelle Fanning notified.

## 2019-11-09 ENCOUNTER — Ambulatory Visit: Payer: Medicaid Other | Admitting: Gastroenterology

## 2019-11-28 ENCOUNTER — Telehealth: Payer: Self-pay | Admitting: Gastroenterology

## 2019-11-29 ENCOUNTER — Ambulatory Visit: Payer: Medicaid Other | Admitting: Nurse Practitioner

## 2019-11-29 ENCOUNTER — Other Ambulatory Visit: Payer: Self-pay

## 2019-11-29 MED ORDER — LACTULOSE 10 GM/15ML PO SOLN: ORAL | 0 refills | Status: DC

## 2019-11-29 NOTE — Telephone Encounter (Signed)
Prescription sent to pharmacy. Pt aware. 

## 2019-12-23 ENCOUNTER — Encounter: Payer: Self-pay | Admitting: Gastroenterology

## 2020-01-26 ENCOUNTER — Ambulatory Visit: Payer: Medicaid Other | Admitting: Gastroenterology

## 2020-03-22 ENCOUNTER — Ambulatory Visit: Payer: Medicaid Other | Admitting: Gastroenterology

## 2020-03-22 ENCOUNTER — Telehealth: Payer: Self-pay | Admitting: Gastroenterology

## 2020-03-22 NOTE — Telephone Encounter (Signed)
Please send to nurse.

## 2020-03-22 NOTE — Telephone Encounter (Signed)
LMOM for patient to call back.

## 2020-03-23 ENCOUNTER — Telehealth: Payer: Self-pay | Admitting: Gastroenterology

## 2020-03-23 ENCOUNTER — Other Ambulatory Visit: Payer: Self-pay | Admitting: Gastroenterology

## 2020-03-23 DIAGNOSIS — K7460 Unspecified cirrhosis of liver: Secondary | ICD-10-CM

## 2020-03-23 DIAGNOSIS — R188 Other ascites: Secondary | ICD-10-CM

## 2020-03-23 NOTE — Telephone Encounter (Signed)
Pt calling back, please call

## 2020-03-23 NOTE — Telephone Encounter (Signed)
Spoke to patient this morning who is requesting a paracinesis. She reports abdominal distention with some difficultly breathing. An order has been faxed to [facility] for large volume paracentesis and labs. She will restart Aldactone 50 mg daily. Patient will call the office for follow up with either Dr Chales Abrahams or one of the APP's.

## 2020-03-23 NOTE — Telephone Encounter (Signed)
LMOM for patient to call back.

## 2020-03-26 ENCOUNTER — Telehealth: Payer: Self-pay | Admitting: Gastroenterology

## 2020-03-26 NOTE — Telephone Encounter (Signed)
Ann Wall from [facility] was able to get the pt scheduled for her Paracentesis but the pt is not answering her phone to be notified.

## 2020-03-26 NOTE — Telephone Encounter (Signed)
Thanks for letting me know RG 

## 2020-03-26 NOTE — Telephone Encounter (Signed)
Spoke to [place] from [facility] IR. They have scheduled patient for paracentesis today at 10am.Wendy tried to contact patient last week to inform her of the date and time. No voicemail to leave a message. If she wants to contact IR to reschedule,the order is good for 3 months she can call at [phone].

## 2020-03-28 ENCOUNTER — Other Ambulatory Visit: Payer: Self-pay | Admitting: *Deleted

## 2020-03-28 NOTE — Patient Outreach (Signed)
[name]) Care Management  03/28/2020  Ann Wall Jun 26, 1960 [id number]  Telephone consent received from [name]. Her husband was referred for services and she is in need also. Priority need is social work assistance, possible CAP referral, transportation.  NP to follow for her medical needs.  Referral put in for LCSW today.  Zara Council. Burgess Estelle, MSN, [facility] Gerontological Nurse Practitioner [facility] Care Management [phone]

## 2020-03-30 ENCOUNTER — Other Ambulatory Visit: Payer: Self-pay | Admitting: *Deleted

## 2020-03-30 NOTE — Patient Outreach (Signed)
[facility]) Care Management  03/30/2020  Ann Wall 1960-06-22 [id number]  CSW advised to cancel referral received for this pt due to pt being eligible/followed by another program.   [name], MSW, LCSW Clinical Social Worker  [facility] [employer] [phone]

## 2020-04-16 ENCOUNTER — Telehealth: Payer: Self-pay | Admitting: Gastroenterology

## 2020-04-16 NOTE — Telephone Encounter (Signed)
Inbound call from patient stating she went for her paracentesis appointment on 04/05/20 at [facility] and is stating she needs another appointment asap.  She can be reached at [phone].

## 2020-04-17 ENCOUNTER — Telehealth: Payer: Self-pay | Admitting: Gastroenterology

## 2020-04-17 NOTE — Telephone Encounter (Signed)
Spoke to patient to inform her that Dr Chales Abrahams has been notified regarding her symptoms, but he was working in the hospital this week. It may take longer to get a response. She was advised to go to the ED if her SX worsened. Patient voiced understanding

## 2020-04-17 NOTE — Telephone Encounter (Signed)
Spoke to patient who is reporting abdominal distention and lower leg and feet swelling. Her abdomen is firm to touch 8 pound weight gain since 04/05/20 paracentesis (removed 8 L) takes Lasix 40 mg and Aldactone 50 mg daily. Patient is requesting a repeat paracentesis at [facility]. She has a follow up appointment in office on 05/01/20. Dr Chales Abrahams please advise.

## 2020-04-23 ENCOUNTER — Other Ambulatory Visit: Payer: Self-pay | Admitting: Gastroenterology

## 2020-04-23 DIAGNOSIS — K7460 Unspecified cirrhosis of liver: Secondary | ICD-10-CM

## 2020-04-23 DIAGNOSIS — R188 Other ascites: Secondary | ICD-10-CM

## 2020-04-23 NOTE — Telephone Encounter (Signed)
Proceed with paracentesis at [facility] Instructions as before RG

## 2020-04-23 NOTE — Telephone Encounter (Signed)
Spoke to patient to let her know that orders have been sent to [facility] for a paracentesis. They will cont the patient with a date and time.

## 2020-04-23 NOTE — Telephone Encounter (Signed)
LMOM for patient to call back.

## 2020-04-25 ENCOUNTER — Telehealth: Payer: Self-pay | Admitting: Gastroenterology

## 2020-04-25 NOTE — Telephone Encounter (Signed)
Signed orders sent to [facility] radiology

## 2020-04-25 NOTE — Telephone Encounter (Signed)
Inbound call from Toniann Fail stating she received the order for the patient for the paracentesis but did not received the physician's signature.  Requesting if it can be refax to [phone] please.

## 2020-05-01 ENCOUNTER — Ambulatory Visit: Payer: Medicaid Other | Admitting: Gastroenterology

## 2020-05-22 NOTE — Telephone Encounter (Signed)
error 

## 2020-06-12 ENCOUNTER — Telehealth: Payer: Self-pay | Admitting: Gastroenterology

## 2020-06-12 NOTE — Telephone Encounter (Signed)
Pt's daughter Lindie Spruce would like to speak with a nurse about her mother's prognosis and if hospice would be something that pt's family should consider at this point. PLs call her at [phone].

## 2020-06-12 NOTE — Telephone Encounter (Signed)
Spoke with patient's daughter Aundra Millet to let her know that it is difficult  to determine what patient's prognosis is at this time as she has not been evaluated by Dr Chales Abrahams recently. She was advised for patient to schedule an appointment for Dr Chales Abrahams to access her,and decide what the best plan of care would be for her mom. She will talk to patient to discuss scheduling an appointment with Dr Chales Abrahams.

## 2020-06-19 ENCOUNTER — Telehealth: Payer: Self-pay | Admitting: Gastroenterology

## 2020-06-20 ENCOUNTER — Telehealth: Payer: Self-pay | Admitting: Gastroenterology

## 2020-06-20 NOTE — Telephone Encounter (Signed)
Patient calling back to check on status on scheduling appt.  States it's urgent.

## 2020-06-20 NOTE — Telephone Encounter (Signed)
Called patient twice and left messages to call our office to discuss scheduling paracentesis

## 2020-06-20 NOTE — Telephone Encounter (Signed)
Orders for paracentesis have been placed a few times and faxed to [facility]. Radiology reports patient was a no show both times. A new order form faxed to [facility]. They will contact her with a date and time. Patient has been notified.

## 2020-06-21 ENCOUNTER — Other Ambulatory Visit: Payer: Self-pay | Admitting: Gastroenterology

## 2020-06-21 DIAGNOSIS — K7460 Unspecified cirrhosis of liver: Secondary | ICD-10-CM

## 2020-06-21 DIAGNOSIS — R188 Other ascites: Secondary | ICD-10-CM

## 2020-06-21 NOTE — Telephone Encounter (Signed)
[facility] called requesting a standing order to be placed for patients paracentesis please advise.

## 2020-06-21 NOTE — Telephone Encounter (Signed)
Standing orders for paracentesis placed and faxed to [facility].

## 2020-06-28 ENCOUNTER — Other Ambulatory Visit: Payer: Self-pay | Admitting: Gastroenterology

## 2020-06-28 ENCOUNTER — Telehealth: Payer: Self-pay | Admitting: Gastroenterology

## 2020-06-28 DIAGNOSIS — R188 Other ascites: Secondary | ICD-10-CM

## 2020-06-28 DIAGNOSIS — K7460 Unspecified cirrhosis of liver: Secondary | ICD-10-CM

## 2020-06-28 NOTE — Telephone Encounter (Signed)
Spoke to patient who is requesting a paracenteses due to 10 pound weight gain in 1 week. Patient reports abdominal distention. Order for paracentisis place per Dr Urban Gibson verbal order. Orders faxed to [facility]. They will contact patient to schedule.

## 2020-06-28 NOTE — Telephone Encounter (Signed)
Inbound call from patient requesting to be scheduled for parenthesis as soon as possible at [name] please.

## 2020-07-23 ENCOUNTER — Telehealth: Payer: Self-pay | Admitting: Gastroenterology

## 2020-07-23 DIAGNOSIS — K7460 Unspecified cirrhosis of liver: Secondary | ICD-10-CM

## 2020-07-23 DIAGNOSIS — R188 Other ascites: Secondary | ICD-10-CM

## 2020-07-23 NOTE — Telephone Encounter (Signed)
Pt requesting paracentesis. Please advise regarding orders.

## 2020-07-23 NOTE — Telephone Encounter (Signed)
Vernona Rieger from [facility] Interventional Radiology is requesting a Paracentesis order.  Fax: [phone]

## 2020-07-26 NOTE — Telephone Encounter (Signed)
Faxed order for 10 liters for Paracentesis as previous in chart to [facility]

## 2020-07-26 NOTE — Telephone Encounter (Signed)
Please go ahead and set her up for paracentesis at [facility] as before Same orders as before RG

## 2020-07-27 ENCOUNTER — Ambulatory Visit: Payer: Medicaid Other | Admitting: Gastroenterology

## 2020-08-14 ENCOUNTER — Ambulatory Visit: Payer: Medicaid Other | Admitting: Gastroenterology

## 2020-08-20 ENCOUNTER — Telehealth: Payer: Self-pay | Admitting: Gastroenterology

## 2020-08-20 NOTE — Telephone Encounter (Signed)
Inbound call from patient requesting paracentesis be done at [facility].  Please advise.

## 2020-08-21 NOTE — Telephone Encounter (Signed)
Dr. Chales Abrahams pt calling wanting a paracentesis set up at [facility]. Please advise.

## 2020-08-21 NOTE — Telephone Encounter (Signed)
-   Proceed with US guided LVP (max 10 liters)  If above 5 liters, give 25% at 25 g of albumin IV  If above 7 liters, give 25% at 50 g of albumin IV. If there is prior radiology protocol in place, then please disregard above and go by protocol.  -OLNY If fluid is turbid send for culture, cell count, albumin, total protein and cytology.  -Also get CBC with diff, CMP, PT INR just prior to LVP.  -Check weight after completion of paracentesis prior to discharge.  -Pl call Dr Chales Abrahams [phone] in case of any ?/concerns.   RG

## 2020-08-21 NOTE — Telephone Encounter (Signed)
Orders faxed to [facility] for paracentesis at [phone], they will contact pt regarding an appt. Pt aware.

## 2020-08-21 NOTE — Telephone Encounter (Signed)
French Ana can you help me with this? I have not set one up at [place].

## 2020-08-24 ENCOUNTER — Encounter: Payer: Self-pay | Admitting: Gastroenterology

## 2020-08-24 LAB — PROTIME-INR: INR: 1.1 (ref 0.9–1.1)

## 2020-09-03 ENCOUNTER — Ambulatory Visit (INDEPENDENT_AMBULATORY_CARE_PROVIDER_SITE_OTHER): Payer: Medicaid Other | Admitting: Gastroenterology

## 2020-09-03 ENCOUNTER — Other Ambulatory Visit (INDEPENDENT_AMBULATORY_CARE_PROVIDER_SITE_OTHER): Payer: Medicaid Other

## 2020-09-03 ENCOUNTER — Other Ambulatory Visit: Payer: Self-pay

## 2020-09-03 ENCOUNTER — Encounter: Payer: Self-pay | Admitting: Gastroenterology

## 2020-09-03 VITALS — HR 64 | Ht 64.0 in | Wt 82.0 lb

## 2020-09-03 DIAGNOSIS — K7682 Hepatic encephalopathy: Secondary | ICD-10-CM

## 2020-09-03 DIAGNOSIS — K7460 Unspecified cirrhosis of liver: Secondary | ICD-10-CM

## 2020-09-03 DIAGNOSIS — R188 Other ascites: Secondary | ICD-10-CM | POA: Diagnosis not present

## 2020-09-03 DIAGNOSIS — K7290 Hepatic failure, unspecified without coma: Secondary | ICD-10-CM

## 2020-09-03 MED ORDER — SPIRONOLACTONE 50 MG PO TABS: 50.0000 mg | ORAL_TABLET | Freq: Every day | ORAL | 6 refills | Status: DC

## 2020-09-03 NOTE — Patient Instructions (Signed)
If you are age 60 or older, your body mass index should be between 23-30. Your Body mass index is 14.08 kg/m. If this is out of the aforementioned range listed, please consider follow up with your Primary Care Provider.  If you are age 1 or younger, your body mass index should be between 19-25. Your Body mass index is 14.08 kg/m. If this is out of the aformentioned range listed, please consider follow up with your Primary Care Provider.   Please go to the lab on the 2nd floor [address] before you leave the office today.   I will call you regarding your standing orders for [facility].   Thank you,  Dr. Lynann Bologna

## 2020-09-03 NOTE — Progress Notes (Signed)
Chief Complaint: FU  Referring Provider:  Bonnita Nasuti, MD      ASSESSMENT AND PLAN;   .#1. 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 Was being followed by Dr Melina Copa  #2. Subclinical hepatic encephalopathy  #3. Refractory ascites. LVP dependent.  #4. SZ D/O  #5. [place] screening   Plan: -LVP at [facility] Q2 weeks. -Check CBC, CMP, PT INR, AFP, ammonia, HBV viral load, HBeAg, anti HBsAb, anti HbcAb, HAV total. -Lasix 40 mg p.o. QD to continue.  Will gradually increase diuretics -Spironolactone 50 mg p.o. QD #30 6 refills -Salt restricted diet.  2g Na, normal protein diet. -IR for TIPS eval. Will let them order CTA (d/t shortage of contrast). Will order 2DE, r/o R sided failure -Continue propralolol 10 bid -Continue lactulose and titrate to 2-3 BMs per day. -May have to add rifaximin. -Patient is not a candidate for liver transplantation d/t multiple comorbid conditions. -Discussed extensively with pt and pt's husband. They do understand poor long-term prognosis. -Avoid NSAIDs. -FU in 24 weeks.  At follow-up, would consider EGD for EV screening.   HPI:    Ann Wall is a 60 y.o. female  With multiple comorbidities  Here for follow-up visit.  She has been getting LVP every 2 to 3 weeks at [facility].  No SBP.  Has been compliant with low-salt diet.  Has only been taking Lasix 40 mg p.o. once a day.  There has been some confusion regarding Aldactone.  Accompanied by her husband.  Would like to be evaluated for TIPS.  No nausea, vomiting, heartburn, regurgitation, odynophagia or dysphagia.  No significant diarrhea or constipation. No melena or hematochezia. No unintentional weight loss. No abdominal pain.  She does have abdominal discomfort when she gets distended.  No nonsteroidals.  Has not been getting excessively forgetful.  Has been on lactulose.  Most recently, she is having softer bowel movements at the  frequency of 2 to 3/day without lactulose.  Previously rifaximin was not covered.  No jaundice dark urine or pale stools.  She does have history of easy bruisability.  We have received few records: Addendum-we got notes from Dr Haque-significant for pos HBsAg, neg HCV, HbcAb IgM. HAV IgM From 07/19/2019: Hb 13.3, MCV 84, WBC count 7.2, platelets 318. HBA1c 5.1 Iron studies were negative with ferritin 31 LFTs Albumin 3.2, alk phos 239, ALT 22, AST 45, creatinine 0.66. GGT 147, potassium 3.5, LDH 225, sodium 140, total bilirubin 1.0, magnesium 1.9.  Tox screen was positive for barbiturates and benzodiazepines. NH3 91 Rest of the labs, CT, discharge summary from [facility] awaited.  CT AP May 2019 Cirrhotic liver with associated moderate to moderately large volume of abdominal and pelvic ascites. Recanalization of the umbilical vein and splenomegaly compatible with portal venous hypertension also seen. Large volume of fluid in the right trochanteric bursa consistent with bursitis. Atherosclerosis.  Past Medical History:  Diagnosis Date  . Alcoholic cirrhosis ([place])   . Ankle fracture, left   . Arrhythmia   . Ascites   . Cervical spondylosis without myelopathy 08/30/2012  . Chronic liver disease   . Degenerative arthritis   . Depression with anxiety   . High cholesterol   . Hypertension   . Hypothyroidism   . Lumbosacral spondylosis without myelopathy 08/30/2012  . Pelvic fracture ([facility])    Bilateral  . Seizure ([place])   . Vitamin D deficiency     Past Surgical History:  Procedure  Laterality Date  . CERVICAL SPINE SURGERY    . CESAREAN SECTION  [phone]  . GALLBLADDER SURGERY    . IR PARACENTESIS  08/29/2019  . LUMBAR SPINE SURGERY  2009  . REPLACEMENT TOTAL KNEE     x2  . WRIST SURGERY Right     Family History  Problem Relation Age of Onset  . Hypertension Mother   . High Cholesterol Mother   . Hypertension Father   . Diabetes Father   . High Cholesterol  Father   . Stroke Father   . Colon polyps Father   . Cancer Maternal Aunt        Breast cancer  . Hypertension Paternal Aunt   . Stroke Paternal Aunt   . Hypertension Paternal Uncle   . Stroke Paternal Uncle   . Colon cancer Neg Hx   . Esophageal cancer Neg Hx     Social History   Tobacco Use  . Smoking status: Current Every Day Smoker    Packs/day: 0.50    Types: Cigarettes  . Smokeless tobacco: Never Used  Vaping Use  . Vaping Use: Never used  Substance Use Topics  . Alcohol use: No    Comment: quit 3 years ago   . Drug use: No    Current Outpatient Medications  Medication Sig Dispense Refill  . furosemide (LASIX) 40 MG tablet Take 40 mg by mouth daily.    Marland Kitchen lactulose (CHRONULAC) 10 GM/15ML solution TAKE 1 TABLESPOON (15ML) BY MOUTH TWICE DAILY FOR 30 DAYS 236 mL 0  . levETIRAcetam (KEPPRA) 750 MG tablet Take 750 mg by mouth 2 (two) times daily.    Marland Kitchen levothyroxine (SYNTHROID, LEVOTHROID) 25 MCG tablet Take 25 mcg by mouth daily before breakfast.    . oxyCODONE (ROXICODONE) 15 MG immediate release tablet Take 15 mg by mouth QID.    Marland Kitchen POTASSIUM PO Take 1 tablet by mouth 2 (two) times daily.    . primidone (MYSOLINE) 50 MG tablet Take 100 mg by mouth daily.    . propranolol (INDERAL) 10 MG tablet Take 10 mg by mouth 2 (two) times daily.    Marland Kitchen ALPRAZolam (XANAX) 1 MG tablet Take 1 mg by mouth 4 (four) times daily. (Patient not taking: Reported on 09/03/2020)     No current facility-administered medications for this visit.    Allergies  Allergen Reactions  . Penicillins     Review of Systems:  Constitutional: Denies fever, chills, diaphoresis, appetite change and has fatigue.  HEENT: Denies photophobia, eye pain, redness, hearing loss, ear pain, congestion, sore throat, rhinorrhea, sneezing, mouth sores, neck pain, neck stiffness and tinnitus.   Respiratory: Denies SOB, DOE, cough, chest tightness,  and wheezing.   Cardiovascular: Denies chest pain, palpitations and leg  swelling.  Genitourinary: Denies dysuria, urgency, frequency, hematuria, flank pain and difficulty urinating.  Musculoskeletal: Has myalgias, back pain, joint swelling, arthralgias and gait problem.  Skin: No rash.  Neurological: Denies dizziness, seizures, syncope, weakness, light-headedness, numbness and headaches.  Hematological: Denies adenopathy. Easy bruising, personal or family bleeding history  Psychiatric/Behavioral: Has anxiety or depression, posttraumatic stress disorder     Physical Exam:    Pulse 64   Ht 5' 4"  (1.626 m)   Wt 82 lb (37.2 kg)   BMI 14.08 kg/m  Wt Readings from Last 3 Encounters:  09/03/20 82 lb (37.2 kg)  08/25/19 96 lb (43.5 kg)  08/30/12 122 lb (55.3 kg)   Constitutional: Cachectic, in no acute distress. Psychiatric: Normal mood and affect.  Behavior is normal. HEENT: Pupils normal.  Conjunctivae are normal. No scleral icterus. Cardiovascular: Normal rate, regular rhythm. No edema Pulmonary/chest: Bilateral decreased breath sounds, no wheezing, rales or rhonchi. Abdominal: mild ascites with prominent abdominal veins.  Bowel sounds active throughout. There are no masses palpable. No hepatomegaly. Rectal:  defered Neurological: Alert and oriented to person place and time. Skin: Skin is warm and dry. No rashes noted.  Extremities with 2+ edema  Data Reviewed: I have personally reviewed following labs and imaging studies  CBC: CBC Latest Ref Rng & Units 09/06/2019 05/15/2010  WBC 4.0 - 10.5 K/uL 5.3 4.7  Hemoglobin 12.0 - 15.0 g/dL 12.5 15.0  Hematocrit 36.0 - 46.0 % 37.3 44.0  Platelets 150.0 - 400.0 K/uL 293.0 162    CMP: CMP Latest Ref Rng & Units 09/06/2019 05/15/2010  Glucose 70 - 99 mg/dL 124(H) 80  BUN 6 - 23 mg/dL 8 3(L)  Creatinine 0.40 - 1.20 mg/dL 0.49 0.49  Sodium 135 - 145 mEq/L 133(L) 139  Potassium 3.5 - 5.1 mEq/L 3.6 4.2  Chloride 96 - 112 mEq/L 92(L) 100  CO2 19 - 32 mEq/L 34(H) 29  Calcium 8.4 - 10.5 mg/dL 8.6 9.7  Total  Protein 6.0 - 8.3 g/dL 6.4 -  Total Bilirubin 0.2 - 1.2 mg/dL 0.9 -  Alkaline Phos 39 - 117 U/L 260(H) -  AST 0 - 37 U/L 26 -  ALT 0 - 35 U/L 11 -     Carmell Austria, MD 09/03/2020, 3:13 PM  Cc: Bonnita Nasuti, MD

## 2020-09-04 LAB — AFP TUMOR MARKER: AFP-Tumor Marker: 3.8 ng/mL

## 2020-09-04 LAB — COMPREHENSIVE METABOLIC PANEL
ALT: 11 IU/L (ref 0–32)
AST: 23 IU/L (ref 0–40)
Albumin/Globulin Ratio: 1.6 (ref 1.2–2.2)
Albumin: 4.1 g/dL (ref 3.8–4.9)
Alkaline Phosphatase: 190 IU/L — ABNORMAL HIGH (ref 44–121)
BUN/Creatinine Ratio: 14 (ref 12–28)
BUN: 7 mg/dL — ABNORMAL LOW (ref 8–27)
Bilirubin Total: 0.8 mg/dL (ref 0.0–1.2)
CO2: 30 mmol/L — ABNORMAL HIGH (ref 20–29)
Calcium: 8.8 mg/dL (ref 8.7–10.3)
Chloride: 87 mmol/L — ABNORMAL LOW (ref 96–106)
Creatinine, Ser: 0.5 mg/dL — ABNORMAL LOW (ref 0.57–1.00)
Globulin, Total: 2.5 g/dL (ref 1.5–4.5)
Glucose: 80 mg/dL (ref 65–99)
Potassium: 3.1 mmol/L — ABNORMAL LOW (ref 3.5–5.2)
Sodium: 135 mmol/L (ref 134–144)
Total Protein: 6.6 g/dL (ref 6.0–8.5)
eGFR: 107 mL/min/{1.73_m2} (ref >59)

## 2020-09-04 LAB — CBC WITH DIFFERENTIAL/PLATELET
Basophils Absolute: 0.1 10*3/uL (ref 0.0–0.2)
Basos: 1 %
EOS (ABSOLUTE): 0.1 10*3/uL (ref 0.0–0.4)
Eos: 2 %
Hematocrit: 42.8 % (ref 34.0–46.6)
Hemoglobin: 14.2 g/dL (ref 11.1–15.9)
Immature Grans (Abs): 0 10*3/uL (ref 0.0–0.1)
Immature Granulocytes: 0 %
Lymphocytes Absolute: 1.2 10*3/uL (ref 0.7–3.1)
Lymphs: 21 %
MCH: 28.5 pg (ref 26.6–33.0)
MCHC: 33.2 g/dL (ref 31.5–35.7)
MCV: 86 fL (ref 79–97)
Monocytes Absolute: 1 10*3/uL — ABNORMAL HIGH (ref 0.1–0.9)
Monocytes: 17 %
Neutrophils Absolute: 3.4 10*3/uL (ref 1.4–7.0)
Neutrophils: 59 %
Platelets: 269 10*3/uL (ref 150–450)
RBC: 4.99 x10E6/uL (ref 3.77–5.28)
RDW: 16.6 % — ABNORMAL HIGH (ref 11.7–15.4)
WBC: 5.7 10*3/uL (ref 3.4–10.8)

## 2020-09-04 LAB — HEPATITIS B E ANTIGEN: Hep B E Ag: NONREACTIVE

## 2020-09-04 LAB — PROTIME-INR
INR: 1.1 (ref 0.9–1.2)
Prothrombin Time: 11.8 s (ref 9.1–12.0)

## 2020-09-04 LAB — HEPATITIS B SURFACE ANTIBODY,QUALITATIVE: Hep B S Ab: NONREACTIVE

## 2020-09-04 LAB — HEPATITIS B CORE ANTIBODY, TOTAL: Hep B Core Total Ab: NONREACTIVE

## 2020-09-04 LAB — AMMONIA: Ammonia: 34 ug/dL (ref 34–178)

## 2020-09-04 LAB — HEPATITIS A ANTIBODY, TOTAL: Hepatitis A AB,Total: NONREACTIVE

## 2020-09-04 NOTE — Addendum Note (Signed)
Addended by: Alberteen Sam E on: 09/04/2020 03:30 PM   Modules accepted: Orders

## 2020-09-04 NOTE — Addendum Note (Signed)
Addended by: Alberteen Sam E on: 09/04/2020 09:16 AM   Modules accepted: Orders

## 2020-09-06 LAB — HEPATITIS B DNA, ULTRAQUANTITATIVE, PCR
Hepatitis B DNA (Calc): <1 Log IU/mL
Hepatitis B DNA: <10 IU/mL

## 2020-09-10 ENCOUNTER — Other Ambulatory Visit: Payer: Self-pay | Admitting: Gastroenterology

## 2020-09-10 DIAGNOSIS — K7031 Alcoholic cirrhosis of liver with ascites: Secondary | ICD-10-CM

## 2020-09-11 ENCOUNTER — Other Ambulatory Visit: Payer: Self-pay

## 2020-09-11 ENCOUNTER — Encounter: Payer: Self-pay | Admitting: *Deleted

## 2020-09-11 ENCOUNTER — Other Ambulatory Visit: Payer: Self-pay | Admitting: Interventional Radiology

## 2020-09-11 ENCOUNTER — Other Ambulatory Visit: Payer: Self-pay | Admitting: *Deleted

## 2020-09-11 ENCOUNTER — Ambulatory Visit
Admission: RE | Admit: 2020-09-11 | Discharge: 2020-09-11 | Disposition: A | Discharge status: Home / Self Care | Payer: Medicaid Other | Source: Ambulatory Visit | Attending: Gastroenterology | Admitting: Gastroenterology

## 2020-09-11 DIAGNOSIS — K7030 Alcoholic cirrhosis of liver without ascites: Secondary | ICD-10-CM

## 2020-09-11 DIAGNOSIS — K7682 Hepatic encephalopathy: Secondary | ICD-10-CM

## 2020-09-11 DIAGNOSIS — K7290 Hepatic failure, unspecified without coma: Secondary | ICD-10-CM

## 2020-09-11 DIAGNOSIS — K7460 Unspecified cirrhosis of liver: Secondary | ICD-10-CM

## 2020-09-11 DIAGNOSIS — K7031 Alcoholic cirrhosis of liver with ascites: Secondary | ICD-10-CM

## 2020-09-11 DIAGNOSIS — R109 Unspecified abdominal pain: Secondary | ICD-10-CM

## 2020-09-11 DIAGNOSIS — R188 Other ascites: Secondary | ICD-10-CM

## 2020-09-11 HISTORY — PX: IR RADIOLOGIST EVAL & MGMT: IMG5224

## 2020-09-11 NOTE — Progress Notes (Signed)
Echo for patient is June 13th 10am. Will have to arrive 15 minutes prior at [facility]

## 2020-09-11 NOTE — Consult Note (Signed)
Chief Complaint: Patient was consulted remotely today (TeleHealth) for cirrhosis complicated by ascites at the request of Gupta,Rajesh.    Referring Physician(s): Gupta,Rajesh  History of Present Illness: Ann Wall is a 60 y.o. female with a history of EtOH and HBV hepatic cirrhosis complicated by portal hypertension and recurrent symptomatic large volume ascites.  She is currently undergoing frequent paracentesis with removal of between 5 and 10 L each session.  She is currently requiring drainage every 2 weeks.  She is following a fluid restricted and low-sodium diet and is also taking spironolactone 50 mg and Lasix 40 mg daily.  Previously, she was on lactulose however this has been stopped as she has regular bowel movements 3-4 times daily at baseline and the lactulose was causing abdominal discomfort.  She denies a past history of hepatic encephalopathy, confusion, dizziness or clumsiness.  Additionally, she denies any history of heart failure or heart troubles.  She complains of abdominal distention and discomfort.  Additionally, she has an umbilical hernia which is protuberant and bothersome when her ascites becomes tense.  She no longer drinks alcohol, her last alcohol consumption was in 2018.  She is not a liver transplant candidate secondary to multiple comorbidities.  Hepatic reserve is good.  Her Na-MELD score is 7.  Dr. Urban Gibson office is ordering an echocardiogram to assess her right heart.    Past Medical History:  Diagnosis Date  . Alcoholic cirrhosis (HCC)   . Ankle fracture, left   . Arrhythmia   . Ascites   . Cervical spondylosis without myelopathy 08/30/2012  . Chronic liver disease   . Degenerative arthritis   . Depression with anxiety   . High cholesterol   . Hypertension   . Hypothyroidism   . Lumbosacral spondylosis without myelopathy 08/30/2012  . Pelvic fracture (HCC)    Bilateral  . Seizure (HCC)   . Vitamin D deficiency     Past Surgical  History:  Procedure Laterality Date  . CERVICAL SPINE SURGERY    . CESAREAN SECTION  [phone]  . GALLBLADDER SURGERY    . IR PARACENTESIS  08/29/2019  . LUMBAR SPINE SURGERY  2009  . REPLACEMENT TOTAL KNEE     x2  . WRIST SURGERY Right     Allergies: Penicillins  Medications: Prior to Admission medications   Medication Sig Start Date End Date Taking? Authorizing Provider  ALPRAZolam Prudy Feeler) 1 MG tablet Take 1 mg by mouth 4 (four) times daily. Patient not taking: Reported on 09/03/2020    [provider]  furosemide (LASIX) 40 MG tablet Take 40 mg by mouth daily.    [provider]  lactulose (CHRONULAC) 10 GM/15ML solution TAKE 1 TABLESPOON ( ) BY MOUTH TWICE DAILY FOR 30 DAYS 11/29/19   Lynann Bologna, MD  levETIRAcetam (KEPPRA) 750 MG tablet Take 750 mg by mouth 2 (two) times daily.    [provider]  levothyroxine (SYNTHROID, LEVOTHROID) 25 MCG tablet Take 25 mcg by mouth daily before breakfast.    [provider]  oxyCODONE (ROXICODONE) 15 MG immediate release tablet Take 15 mg by mouth QID.    [provider]  POTASSIUM PO Take 1 tablet by mouth 2 (two) times daily.    [provider]  primidone (MYSOLINE) 50 MG tablet Take 100 mg by mouth daily.    [provider]  propranolol (INDERAL) 10 MG tablet Take 10 mg by mouth 2 (two) times daily. 07/31/20   [provider]  spironolactone (ALDACTONE) 50 MG  tablet Take 1 tablet (50 mg total) by mouth daily. 09/03/20   Lynann Bologna, MD     Family History  Problem Relation Age of Onset  . Hypertension Mother   . High Cholesterol Mother   . Hypertension Father   . Diabetes Father   . High Cholesterol Father   . Stroke Father   . Colon polyps Father   . Cancer Maternal Aunt        Breast cancer  . Hypertension Paternal Aunt   . Stroke Paternal Aunt   . Hypertension Paternal Uncle   . Stroke Paternal Uncle   . Colon cancer Neg Hx   . Esophageal cancer  Neg Hx     Social History   Socioeconomic History  . Marital status: Married    Spouse name: Not on file  . Number of children: 3  . Years of education: 12th  . Highest education level: Not on file  Occupational History  . Occupation: Disability  Tobacco Use  . Smoking status: Current Every Day Smoker    Packs/day: 0.50    Types: Cigarettes  . Smokeless tobacco: Never Used  Vaping Use  . Vaping Use: Never used  Substance and Sexual Activity  . Alcohol use: No    Comment: quit 3 years ago   . Drug use: No  . Sexual activity: Not on file  Other Topics Concern  . Not on file  Social History Narrative  . Not on file   Social Determinants of Health   Financial Resource Strain: Not on file  Food Insecurity: Not on file  Transportation Needs: Not on file  Physical Activity: Not on file  Stress: Not on file  Social Connections: Not on file    Review of Systems  Review of Systems: A 12 point ROS discussed and pertinent positives are indicated in the HPI above.  All other systems are negative.  Physical Exam No direct physical exam was performed (except for noted visual exam findings with Video Visits).   Vital Signs: There were no vitals taken for this visit.  Imaging: No results found.  Labs:  CBC: Recent Labs    09/03/20 1606  WBC 5.7  HGB 14.2  HCT 42.8  PLT 269    COAGS: Recent Labs    09/03/20 1606  INR 1.1    BMP: Recent Labs    09/03/20 1606  NA 135  K 3.1*  CL 87*  CO2 30*  GLUCOSE 80  BUN 7*  CALCIUM 8.8  CREATININE 0.50*    LIVER FUNCTION TESTS: Recent Labs    09/03/20 1606  BILITOT 0.8  AST 23  ALT 11  ALKPHOS 190*  PROT 6.6  ALBUMIN 4.1    TUMOR MARKERS: Recent Labs    09/03/20 1606  AFPTM 3.8    Assessment and Plan:  Very pleasant 60 year old female with EtOH and HPV associated hepatic cirrhosis complicated by portal hypertension and large volume recurrent and refractory ascites.  She is requiring frequent  paracentesis which is both inconvenient and medically significant.  Data shows a 50% 1 year mortality wants chronic repeated paracentesis is required.  TIPS has been shown to reduce the volume and frequency of paracentesis and improve survival at 1 year.  I explained the natural history of cirrhosis, portal hypertension and ascites development as well as the TIPS procedure in detail.  She understands that the 3 primary risks from TIPS creation are cardiac decompensation, liver decompensation and hepatic encephalopathy.  We discussed that she  is at low risk for hepatic encephalopathy given her lack of encephalopathy at baseline and low risk of liver decompensation given her meld score of 7 indicating good hepatic reserve.  Further, she has no known history of cardiac problems but an echocardiogram is pending to confirm normal function.  She understands the risks and benefits and strongly desires to proceed with TIPS creation for ascites control.  She is a good candidate.  1.)  BRTO protocol CT scan of the abdomen/pelvis to assess portal vein anatomy and patency.  2.)  Barring unexpected findings on the echocardiogram or CT scan, we should be able to proceed with TIPS creation.  We can begin the process of scheduling her for elective TIPS to be performed in June.   Thank you for this interesting consult.  I greatly enjoyed meeting MADORA BARLETTA and look forward to participating in their care.  A copy of this report was sent to the requesting provider on this date.  Electronically Signed: Sterling Big 09/11/2020, 10:41 AM   I spent a total of  40 Minutes  in remote  clinical consultation, greater than 50% of which was counseling/coordinating care for cirrhosis and ascites.    Visit type: Audio only (telephone). Audio (no video) only due to patient preference. Alternative for in-person consultation at [facility], [address] [place], [place], [place]. This visit type was conducted due  to national recommendations for restrictions regarding the COVID-19 Pandemic (e.g. social distancing).  This format is felt to be most appropriate for this patient at this time.  All issues noted in this document were discussed and addressed.

## 2020-09-19 ENCOUNTER — Telehealth (HOSPITAL_COMMUNITY): Payer: Self-pay | Admitting: Radiology

## 2020-09-19 ENCOUNTER — Other Ambulatory Visit (HOSPITAL_COMMUNITY): Payer: Self-pay | Admitting: Interventional Radiology

## 2020-09-19 DIAGNOSIS — K7460 Unspecified cirrhosis of liver: Secondary | ICD-10-CM

## 2020-09-19 NOTE — Telephone Encounter (Signed)
Called pt to schedule TIPS with Archer Asa. No answer, left VM. JM

## 2020-09-21 ENCOUNTER — Telehealth: Payer: Self-pay

## 2020-09-21 NOTE — Telephone Encounter (Signed)
LVM for patient to call back regarding her hep injections series and repeat lab work that she will do at [facility]

## 2020-09-27 ENCOUNTER — Other Ambulatory Visit: Payer: Self-pay

## 2020-09-27 ENCOUNTER — Ambulatory Visit
Admission: RE | Admit: 2020-09-27 | Discharge: 2020-09-27 | Disposition: A | Discharge status: Home / Self Care | Payer: Medicaid Other | Source: Ambulatory Visit | Attending: Interventional Radiology | Admitting: Interventional Radiology

## 2020-09-27 DIAGNOSIS — K7030 Alcoholic cirrhosis of liver without ascites: Secondary | ICD-10-CM

## 2020-09-30 ENCOUNTER — Inpatient Hospital Stay (HOSPITAL_COMMUNITY)
Admission: AD | Admit: 2020-09-30 | Discharge: 2020-10-02 | DRG: 100 | Disposition: A | Discharge status: Hospice | Payer: Medicaid Other | Source: Other Acute Inpatient Hospital | Attending: Internal Medicine | Admitting: Internal Medicine

## 2020-09-30 ENCOUNTER — Inpatient Hospital Stay (HOSPITAL_COMMUNITY): Payer: Medicaid Other

## 2020-09-30 DIAGNOSIS — K7031 Alcoholic cirrhosis of liver with ascites: Secondary | ICD-10-CM

## 2020-09-30 DIAGNOSIS — G9341 Metabolic encephalopathy: Secondary | ICD-10-CM

## 2020-09-30 DIAGNOSIS — R64 Cachexia: Secondary | ICD-10-CM | POA: Diagnosis present

## 2020-09-30 DIAGNOSIS — Z20822 Contact with and (suspected) exposure to covid-19: Secondary | ICD-10-CM | POA: Diagnosis present

## 2020-09-30 DIAGNOSIS — E876 Hypokalemia: Secondary | ICD-10-CM | POA: Diagnosis present

## 2020-09-30 DIAGNOSIS — I10 Essential (primary) hypertension: Secondary | ICD-10-CM | POA: Diagnosis present

## 2020-09-30 DIAGNOSIS — G40911 Epilepsy, unspecified, intractable, with status epilepticus: Principal | ICD-10-CM | POA: Diagnosis present

## 2020-09-30 DIAGNOSIS — K766 Portal hypertension: Secondary | ICD-10-CM | POA: Diagnosis present

## 2020-09-30 DIAGNOSIS — E8809 Other disorders of plasma-protein metabolism, not elsewhere classified: Secondary | ICD-10-CM | POA: Diagnosis present

## 2020-09-30 DIAGNOSIS — Z83438 Family history of other disorder of lipoprotein metabolism and other lipidemia: Secondary | ICD-10-CM

## 2020-09-30 DIAGNOSIS — D509 Iron deficiency anemia, unspecified: Secondary | ICD-10-CM | POA: Diagnosis present

## 2020-09-30 DIAGNOSIS — Z833 Family history of diabetes mellitus: Secondary | ICD-10-CM

## 2020-09-30 DIAGNOSIS — R627 Adult failure to thrive: Secondary | ICD-10-CM | POA: Diagnosis present

## 2020-09-30 DIAGNOSIS — Z880 Allergy status to penicillin: Secondary | ICD-10-CM

## 2020-09-30 DIAGNOSIS — Z515 Encounter for palliative care: Secondary | ICD-10-CM | POA: Diagnosis not present

## 2020-09-30 DIAGNOSIS — Z79890 Hormone replacement therapy (postmenopausal): Secondary | ICD-10-CM

## 2020-09-30 DIAGNOSIS — Z803 Family history of malignant neoplasm of breast: Secondary | ICD-10-CM

## 2020-09-30 DIAGNOSIS — N390 Urinary tract infection, site not specified: Secondary | ICD-10-CM | POA: Diagnosis present

## 2020-09-30 DIAGNOSIS — J9601 Acute respiratory failure with hypoxia: Secondary | ICD-10-CM | POA: Diagnosis present

## 2020-09-30 DIAGNOSIS — G40901 Epilepsy, unspecified, not intractable, with status epilepticus: Secondary | ICD-10-CM | POA: Diagnosis not present

## 2020-09-30 DIAGNOSIS — Z823 Family history of stroke: Secondary | ICD-10-CM

## 2020-09-30 DIAGNOSIS — R569 Unspecified convulsions: Secondary | ICD-10-CM | POA: Diagnosis present

## 2020-09-30 DIAGNOSIS — F32A Depression, unspecified: Secondary | ICD-10-CM | POA: Diagnosis present

## 2020-09-30 DIAGNOSIS — E43 Unspecified severe protein-calorie malnutrition: Secondary | ICD-10-CM | POA: Diagnosis present

## 2020-09-30 DIAGNOSIS — E039 Hypothyroidism, unspecified: Secondary | ICD-10-CM | POA: Diagnosis present

## 2020-09-30 DIAGNOSIS — R012 Other cardiac sounds: Secondary | ICD-10-CM | POA: Diagnosis not present

## 2020-09-30 DIAGNOSIS — Z8249 Family history of ischemic heart disease and other diseases of the circulatory system: Secondary | ICD-10-CM

## 2020-09-30 DIAGNOSIS — Z8371 Family history of colonic polyps: Secondary | ICD-10-CM

## 2020-09-30 DIAGNOSIS — G40001 Localization-related (focal) (partial) idiopathic epilepsy and epileptic syndromes with seizures of localized onset, not intractable, with status epilepticus: Secondary | ICD-10-CM | POA: Diagnosis not present

## 2020-09-30 DIAGNOSIS — E785 Hyperlipidemia, unspecified: Secondary | ICD-10-CM | POA: Diagnosis present

## 2020-09-30 DIAGNOSIS — F1011 Alcohol abuse, in remission: Secondary | ICD-10-CM | POA: Diagnosis present

## 2020-09-30 DIAGNOSIS — E778 Other disorders of glycoprotein metabolism: Secondary | ICD-10-CM | POA: Diagnosis present

## 2020-09-30 DIAGNOSIS — Z96659 Presence of unspecified artificial knee joint: Secondary | ICD-10-CM | POA: Diagnosis present

## 2020-09-30 DIAGNOSIS — L8990 Pressure ulcer of unspecified site, unspecified stage: Secondary | ICD-10-CM | POA: Insufficient documentation

## 2020-09-30 DIAGNOSIS — Z7401 Bed confinement status: Secondary | ICD-10-CM

## 2020-09-30 DIAGNOSIS — F17210 Nicotine dependence, cigarettes, uncomplicated: Secondary | ICD-10-CM | POA: Diagnosis present

## 2020-09-30 DIAGNOSIS — Z681 Body mass index (BMI) 19 or less, adult: Secondary | ICD-10-CM

## 2020-09-30 DIAGNOSIS — E7800 Pure hypercholesterolemia, unspecified: Secondary | ICD-10-CM | POA: Diagnosis present

## 2020-09-30 DIAGNOSIS — R54 Age-related physical debility: Secondary | ICD-10-CM | POA: Diagnosis present

## 2020-09-30 DIAGNOSIS — Z66 Do not resuscitate: Secondary | ICD-10-CM | POA: Diagnosis not present

## 2020-09-30 DIAGNOSIS — F419 Anxiety disorder, unspecified: Secondary | ICD-10-CM | POA: Diagnosis present

## 2020-09-30 DIAGNOSIS — Z79899 Other long term (current) drug therapy: Secondary | ICD-10-CM

## 2020-09-30 LAB — URINALYSIS, ROUTINE W REFLEX MICROSCOPIC
Bacteria, UA: NONE SEEN
Bilirubin Urine: NEGATIVE
Glucose, UA: NEGATIVE mg/dL
Ketones, ur: 80 mg/dL — AB
Nitrite: NEGATIVE
Protein, ur: 30 mg/dL — AB
RBC / HPF: >50 RBC/hpf — ABNORMAL HIGH (ref 0–5)
Specific Gravity, Urine: 1.014 (ref 1.005–1.030)
WBC, UA: >50 WBC/hpf — ABNORMAL HIGH (ref 0–5)
pH: 6 (ref 5.0–8.0)

## 2020-09-30 LAB — LIPASE, BLOOD: Lipase: 24 U/L (ref 11–51)

## 2020-09-30 LAB — COMPREHENSIVE METABOLIC PANEL
ALT: 13 U/L (ref 0–44)
AST: 22 U/L (ref 15–41)
Albumin: 3.1 g/dL — ABNORMAL LOW (ref 3.5–5.0)
Alkaline Phosphatase: 105 U/L (ref 38–126)
Anion gap: 9 (ref 5–15)
BUN: 9 mg/dL (ref 6–20)
CO2: 27 mmol/L (ref 22–32)
Calcium: 8.6 mg/dL — ABNORMAL LOW (ref 8.9–10.3)
Chloride: 102 mmol/L (ref 98–111)
Creatinine, Ser: 0.57 mg/dL (ref 0.44–1.00)
GFR, Estimated: >60 mL/min (ref >60)
Glucose, Bld: 87 mg/dL (ref 70–99)
Potassium: 3.3 mmol/L — ABNORMAL LOW (ref 3.5–5.1)
Sodium: 138 mmol/L (ref 135–145)
Total Bilirubin: 1.2 mg/dL (ref 0.3–1.2)
Total Protein: 5.7 g/dL — ABNORMAL LOW (ref 6.5–8.1)

## 2020-09-30 LAB — PROTIME-INR
INR: 1.1 (ref 0.8–1.2)
Prothrombin Time: 14.2 seconds (ref 11.4–15.2)

## 2020-09-30 LAB — POCT I-STAT 7, (LYTES, BLD GAS, ICA,H+H)
Acid-Base Excess: 1 mmol/L (ref 0.0–2.0)
Bicarbonate: 26.1 mmol/L (ref 20.0–28.0)
Calcium, Ion: 1.18 mmol/L (ref 1.15–1.40)
HCT: 34 % — ABNORMAL LOW (ref 36.0–46.0)
Hemoglobin: 11.6 g/dL — ABNORMAL LOW (ref 12.0–15.0)
O2 Saturation: 96 %
Patient temperature: 98
Potassium: 3 mmol/L — ABNORMAL LOW (ref 3.5–5.1)
Sodium: 139 mmol/L (ref 135–145)
TCO2: 27 mmol/L (ref 22–32)
pCO2 arterial: 42.8 mmHg (ref 32.0–48.0)
pH, Arterial: 7.392 (ref 7.350–7.450)
pO2, Arterial: 82 mmHg — ABNORMAL LOW (ref 83.0–108.0)

## 2020-09-30 LAB — TYPE AND SCREEN
ABO/RH(D): A POS
Antibody Screen: NEGATIVE

## 2020-09-30 LAB — CBC
HCT: 35.4 % — ABNORMAL LOW (ref 36.0–46.0)
Hemoglobin: 11.2 g/dL — ABNORMAL LOW (ref 12.0–15.0)
MCH: 28.8 pg (ref 26.0–34.0)
MCHC: 31.6 g/dL (ref 30.0–36.0)
MCV: 91 fL (ref 80.0–100.0)
Platelets: 209 10*3/uL (ref 150–400)
RBC: 3.89 MIL/uL (ref 3.87–5.11)
RDW: 15.9 % — ABNORMAL HIGH (ref 11.5–15.5)
WBC: 7.3 10*3/uL (ref 4.0–10.5)
nRBC: 0 % (ref 0.0–0.2)

## 2020-09-30 LAB — SARS CORONAVIRUS 2 (TAT 6-24 HRS): SARS Coronavirus 2: NEGATIVE

## 2020-09-30 LAB — HIV ANTIBODY (ROUTINE TESTING W REFLEX): HIV Screen 4th Generation wRfx: NONREACTIVE

## 2020-09-30 LAB — LACTIC ACID, PLASMA: Lactic Acid, Venous: 1.3 mmol/L (ref 0.5–1.9)

## 2020-09-30 LAB — MAGNESIUM: Magnesium: 1.8 mg/dL (ref 1.7–2.4)

## 2020-09-30 LAB — BILIRUBIN, DIRECT: Bilirubin, Direct: 0.3 mg/dL — ABNORMAL HIGH (ref 0.0–0.2)

## 2020-09-30 LAB — PHOSPHORUS: Phosphorus: 2.6 mg/dL (ref 2.5–4.6)

## 2020-09-30 LAB — GLUCOSE, CAPILLARY
Glucose-Capillary: 85 mg/dL (ref 70–99)
Glucose-Capillary: 135 mg/dL — ABNORMAL HIGH (ref 70–99)

## 2020-09-30 LAB — AMYLASE: Amylase: 22 U/L — ABNORMAL LOW (ref 28–100)

## 2020-09-30 LAB — MRSA NEXT GEN BY PCR, NASAL: MRSA by PCR Next Gen: NOT DETECTED

## 2020-09-30 LAB — AMMONIA: Ammonia: 28 umol/L (ref 9–35)

## 2020-09-30 MED ORDER — DEXMEDETOMIDINE HCL IN NACL 400 MCG/100ML IV SOLN
0.0000 ug/kg/h | INTRAVENOUS | Status: DC
  Administered 2020-09-30: 0.5 ug/kg/h via INTRAVENOUS
  Filled 2020-09-30 (×2): qty 100

## 2020-09-30 MED ORDER — FENTANYL 2500MCG IN NS 250ML (10MCG/ML) PREMIX INFUSION
50.0000 ug/h | INTRAVENOUS | Status: DC
  Administered 2020-09-30: 100 ug/h via INTRAVENOUS
  Administered 2020-10-01: 50 ug/h via INTRAVENOUS
  Filled 2020-09-30: qty 250

## 2020-09-30 MED ORDER — VITAL HIGH PROTEIN PO LIQD: 1000.0000 mL | ORAL | Status: DC

## 2020-09-30 MED ORDER — MIDAZOLAM HCL 2 MG/2ML IJ SOLN
2.0000 mg | INTRAMUSCULAR | Status: DC | PRN
  Administered 2020-10-01: 2 mg via INTRAVENOUS
  Filled 2020-09-30: qty 2

## 2020-09-30 MED ORDER — PANTOPRAZOLE SODIUM 40 MG IV SOLR
40.0000 mg | Freq: Every day | INTRAVENOUS | Status: DC
  Administered 2020-09-30: 40 mg via INTRAVENOUS
  Filled 2020-09-30: qty 40

## 2020-09-30 MED ORDER — MIDAZOLAM 50MG/50ML (1MG/ML) PREMIX INFUSION: 0.0000 mg/h | INTRAVENOUS | Status: DC

## 2020-09-30 MED ORDER — CHLORHEXIDINE GLUCONATE 0.12% ORAL RINSE (MEDLINE KIT)
15.0000 mL | Freq: Two times a day (BID) | OROMUCOSAL | Status: DC
  Administered 2020-09-30 – 2020-10-01 (×2): 15 mL via OROMUCOSAL

## 2020-09-30 MED ORDER — LEVETIRACETAM IN NACL 1000 MG/100ML IV SOLN
1000.0000 mg | Freq: Two times a day (BID) | INTRAVENOUS | Status: DC
  Administered 2020-10-01 – 2020-10-02 (×4): 1000 mg via INTRAVENOUS
  Filled 2020-09-30 (×4): qty 100

## 2020-09-30 MED ORDER — DOCUSATE SODIUM 100 MG PO CAPS: 100.0000 mg | ORAL_CAPSULE | Freq: Two times a day (BID) | ORAL | Status: DC | PRN

## 2020-09-30 MED ORDER — PROSOURCE TF PO LIQD: 45.0000 mL | Freq: Two times a day (BID) | ORAL | Status: DC

## 2020-09-30 MED ORDER — ORAL CARE MOUTH RINSE
15.0000 mL | OROMUCOSAL | Status: DC
  Administered 2020-09-30 – 2020-10-01 (×8): 15 mL via OROMUCOSAL

## 2020-09-30 MED ORDER — MIDAZOLAM BOLUS VIA INFUSION
0.0000 mg | INTRAVENOUS | Status: DC | PRN
Start: 2020-09-30 — End: 2020-09-30
  Filled 2020-09-30: qty 5

## 2020-09-30 MED ORDER — DOCUSATE SODIUM 50 MG/5ML PO LIQD
100.0000 mg | Freq: Two times a day (BID) | ORAL | Status: DC
  Administered 2020-09-30 – 2020-10-01 (×2): 100 mg
  Filled 2020-09-30 (×2): qty 10

## 2020-09-30 MED ORDER — POLYETHYLENE GLYCOL 3350 17 G PO PACK
17.0000 g | PACK | Freq: Every day | ORAL | Status: DC
  Administered 2020-10-01: 17 g
  Filled 2020-09-30: qty 1

## 2020-09-30 MED ORDER — FENTANYL BOLUS VIA INFUSION
50.0000 ug | INTRAVENOUS | Status: DC | PRN
  Filled 2020-09-30: qty 100

## 2020-09-30 MED ORDER — SODIUM CHLORIDE 0.9 % IV SOLN
2000.0000 mg | INTRAVENOUS | Status: AC
  Administered 2020-09-30: 2000 mg via INTRAVENOUS
  Filled 2020-09-30: qty 20

## 2020-09-30 MED ORDER — MIDAZOLAM HCL 2 MG/2ML IJ SOLN: 2.0000 mg | INTRAMUSCULAR | Status: DC | PRN

## 2020-09-30 MED ORDER — CHLORHEXIDINE GLUCONATE CLOTH 2 % EX PADS
6.0000 | MEDICATED_PAD | Freq: Every day | CUTANEOUS | Status: DC
  Administered 2020-09-30: 6 via TOPICAL

## 2020-09-30 MED ORDER — FENTANYL CITRATE (PF) 100 MCG/2ML IJ SOLN
50.0000 ug | Freq: Once | INTRAMUSCULAR | Status: AC
  Administered 2020-09-30: 50 ug via INTRAVENOUS

## 2020-09-30 MED ORDER — OSMOLITE 1.5 CAL PO LIQD
1000.0000 mL | ORAL | Status: DC
  Administered 2020-09-30: 1000 mL

## 2020-09-30 MED ORDER — POLYETHYLENE GLYCOL 3350 17 G PO PACK: 17.0000 g | PACK | Freq: Every day | ORAL | Status: DC | PRN

## 2020-09-30 NOTE — Consult Note (Signed)
Neurology Consultation  Reason for Consult: seizures.  Referring Physician: TF from [facility]. PCCM admitted.   CC: seizure activity at [facility].   History is obtained from: chart.  HPI: Ann Wall is a 60 y.o. female with a PMHx of seizures, HTN, liver cirrhosis with multiple paracenteses for ascites (has one every other week), hypothyroidism, lumbar spondylosis with myelopathy, ETOH abuse (per chart, quit 8 months ago), polysubstance abuse, HLD, anxiety, depression, Vitamin D deficiency, and RA.  Chart reviewed from [facility]. Patient is normally on Keppra 7283m po q12 hours at home, with questionable adherence on admission. UDS + barbituates. Other workup from OSH revealed a normal CBC, TSH 1.20, Vitamin B12 741, Na++ 130, and K+ 3. Apparently, she came to OSH ED a few days ago for AMS and was given Ativan and Geodon. After waking up, she was discharged from ED, then returned again with AMS.   Summary of neuro from OSH: Patient was first seen in consult on 09/28/20. 6-7 year history of seizures. Has not f/up with out patient neurology. Could not tell neurologist if her seizures were related to ETOH cessation in the past, but did  admit to missing her Keppra, unknown number of doses. Per note, husband reported that prior to this admission, patient had been bedbound, crying, confused, and acting strange for 5 days. [place] patient was noted to have a acute focal right sided seizure activity while in ED. At that poiint, it was recommend that patient have workup for provocation of seizure, including infections but seizure was thought to be provoked due to non adherence with Keppra. Tele neurologist did not feel the patient was in status epilepticus at that time. Keppra 1 gm IV q12 hours was recommended at that time, and per records, can not tell if patient received any at OSH.   Tele neurology was reconsulted on 09/30/20, after patient was noted to have a seizure  described as left gaze deviation, left facial twitching and drawing. Upon assessment, patient had eyes open wtth left gaze. She was diagnosed with non convulsive status epilepticus. At that point, the patient was intubated and placed on Versed gtt at 10. Her Keppra was increased to 2 gms q12 hours. Recommendation per neurologist was "if multiple titrations of Versed are needed, can given the patient Fosphenytoin 1 gm once and then Dilantin 1034mIV every q 8 hrs". In review of MAR from OSH, it does not appear Dilantin was started. Also, maintenance Keppra was not listed of MAR either. Transfer to [facility] was recommended and patient arrived today.   OSH. Patient was accepted today from OSH for further evaluation of seizure activity. Patient arrived today on Versed 83m66mrip. Also on Fentanyl just bumped to 100 from 50. When Versed wore off, patient woke up while NP in the room and was restless. PCCM is possibly going to start Precedex and placed order for prn Versed.   NP called husband to get information on the patient's baseline functional status. He states that for the last 5 days, the patient has mainly been in bed. Her last po intake was 2 days ago. When asked about her activity 2 weeks ago, she was able to get herself OOB, do some ADLs, toilet, dress herself and ambulate with a cane. She has not driven in awhile. Husband states patient normally (all the time), sits in a chair and watches TV. She does not leave the house much at all. Husband reports that he not in good health, and  he needs help because he is no longer able to take care of her.   ROS: Unable to obtain due to altered mental status.   Past Medical History:  Diagnosis Date   Alcoholic cirrhosis ([place])    Ankle fracture, left    Arrhythmia    Ascites    Cervical spondylosis without myelopathy 08/30/2012   Chronic liver disease    Degenerative arthritis    Depression with anxiety    High cholesterol    Hypertension    Hypothyroidism     Lumbosacral spondylosis without myelopathy 08/30/2012   Pelvic fracture (HCC)    Bilateral   Seizure (HCC)    Vitamin D deficiency      Family History  Problem Relation Age of Onset   Hypertension Mother    High Cholesterol Mother    Hypertension Father    Diabetes Father    High Cholesterol Father    Stroke Father    Colon polyps Father    Cancer Maternal Aunt        Breast cancer   Hypertension Paternal Aunt    Stroke Paternal Aunt    Hypertension Paternal Uncle    Stroke Paternal Uncle    Colon cancer Neg Hx    Esophageal cancer Neg Hx     Social History:   reports that she has been smoking cigarettes. She has been smoking an average of 0.50 packs per day. She has never used smokeless tobacco. She reports that she does not drink alcohol and does not use drugs.  Medications  Current Facility-Administered Medications:    Chlorhexidine Gluconate Cloth 2 % PADS 6 each, 6 each, Topical, Daily, Icard, Bradley L, DO   dexmedetomidine (PRECEDEX) 400 MCG/100ML (4 mcg/mL) infusion, 0-1.2 mcg/kg/hr, Intravenous, Continuous, Merlene Laughter F, NP, Last Rate: 4.58 mL/hr at 09/30/20 1548, 0.5 mcg/kg/hr at 09/30/20 1548   docusate (COLACE) 50 MG/5ML liquid 100 mg, 100 mg, Per Tube, BID, Merlene Laughter F, NP   docusate sodium (COLACE) capsule 100 mg, 100 mg, Oral, BID PRN, Merlene Laughter F, NP   feeding supplement (PROSource TF) liquid 45 mL, 45 mL, Per Tube, BID, Merlene Laughter F, NP   feeding supplement (VITAL HIGH PROTEIN) liquid 1,000 mL, 1,000 mL, Per Tube, Q24H, Merlene Laughter F, NP   fentaNYL (SUBLIMAZE) bolus via infusion 50-100 mcg, 50-100 mcg, Intravenous, Q15 min PRN, Merlene Laughter F, NP   fentaNYL (SUBLIMAZE) injection 50 mcg, 50 mcg, Intravenous, Once, Merlene Laughter F, NP   fentaNYL 2572mg in NS 2542m(1038mml) infusion-PREMIX, 50-200 mcg/hr, Intravenous, Continuous, DavRosana Hoeshitney F, NP   midazolam (VERSED) injection 2 mg, 2 mg, Intravenous, Q15 min PRN, DavMerlene Laughter  NP   midazolam (VERSED) injection 2 mg, 2 mg, Intravenous, Q2H PRN, DavMerlene Laughter NP   pantoprazole (PROTONIX) injection 40 mg, 40 mg, Intravenous, QHS, Davis, Whitney F, NP   polyethylene glycol (MIRALAX / GLYCOLAX) packet 17 g, 17 g, Oral, Daily PRN, DavMerlene Laughter NP   polyethylene glycol (MIRALAX / GLYCOLAX) packet 17 g, 17 g, Per Tube, Daily, DavMerlene Laughter NP   Exam: Current vital signs: BP 118/72   Pulse 79   Temp 98 F (36.7 C) (Oral)   Resp 16   Ht 5' (1.524 m)   Wt 36.6 kg   SpO2 100%   BMI 15.76 kg/m  Vital signs in last 24 hours: Temp:  [98 F (36.7 C)] 98 F (36.7 C) (06/12 1400) Pulse Rate:  [79-84] 79 (06/12 1527) Resp:  [  16-33] 16 (06/12 1527) BP: (118)/(72) 118/72 (06/12 1400) SpO2:  [100 %] 100 % (06/12 1527) FiO2 (%):  [35 %] 35 % (06/12 1527) Weight:  [36.6 kg] 36.6 kg (06/12 1444)  PE: GENERAL: Very poor appearing, cachetic female who is waking up and trying to sit up.  HEENT:  Normocephalic and atraumatic. Temporal wasting.  LUNGS - intubated on ventilator.  CV - RRR on tele. ABDOMEN - Soft, nontender. Ext: warm, well perfused.  NEURO: GCS: Best eye  4   Best motor  5     Best verbal    1   Total  10 Mental Status: Awake. Unable to test orientation due to intubation. She did attempt to squeeze NPs hand on the right side but unknown if this was purposeful. Not following other commands.  Speech/Language: intubated CN: PERRL, (+) corneals, oculocephalics, cough, gag Motor & sensory: only withdraws to pain LEs and more in RUE than LUE. Tries to reach for ETT.   Labs I have reviewed labs in epic and the results pertinent to this consultation are: TSH 1.20 at OSH.  Vitamin B12 741 at OSH.   CBC    Component Value Date/Time   WBC 5.7 09/03/2020 1606   WBC 5.3 09/06/2019 1601   RBC 4.99 09/03/2020 1606   RBC 4.62 09/06/2019 1601   HGB 11.6 (L) 09/30/2020 1459   HGB 14.2 09/03/2020 1606   HCT 34.0 (L) 09/30/2020 1459   HCT 42.8  09/03/2020 1606   PLT 269 09/03/2020 1606   MCV 86 09/03/2020 1606   MCH 28.5 09/03/2020 1606   MCH 36.7 (H) 05/15/2010 1354   MCHC 33.2 09/03/2020 1606   MCHC 33.6 09/06/2019 1601   RDW 16.6 (H) 09/03/2020 1606   LYMPHSABS 1.2 09/03/2020 1606   MONOABS 0.7 09/06/2019 1601   EOSABS 0.1 09/03/2020 1606   BASOSABS 0.1 09/03/2020 1606    CMP     Component Value Date/Time   NA 139 09/30/2020 1459   NA 135 09/03/2020 1606   K 3.0 (L) 09/30/2020 1459   CL 87 (L) 09/03/2020 1606   CO2 30 (H) 09/03/2020 1606   GLUCOSE 80 09/03/2020 1606   GLUCOSE 124 (H) 09/06/2019 1601   BUN 7 (L) 09/03/2020 1606   CREATININE 0.50 (L) 09/03/2020 1606   CALCIUM 8.8 09/03/2020 1606   PROT 6.6 09/03/2020 1606   ALBUMIN 4.1 09/03/2020 1606   AST 23 09/03/2020 1606   ALT 11 09/03/2020 1606   ALKPHOS 190 (H) 09/03/2020 1606   BILITOT 0.8 09/03/2020 1606   GFRNONAA >60 05/15/2010 1354   GFRAA  05/15/2010 1354    >60        The eGFR has been calculated using the MDRD equation. This calculation has not been validated in all clinical situations. eGFR's persistently <60 mL/min signify possible Chronic Kidney Disease.    Imaging MD reviewed the images obtained  CT head at OSH-nothing acute.   Assessment: 60 yo female with multiple comorbidities who is a transfer from [facility] today for management of seizures and EEG. There is questionable cessation of ETOH and no level from OSH. If she has been drinking, then stopped abruptly, her seizures could be from withdrawal. She does have a seizure history which which was called non convulsive at OSH. It would appear she had 2 CTHs and the 2nd one is timed that it seems to have been done after seizure activity in ED. After being off Versed 39m here, she began to wake up.  She has brain stem reflexes and no noted seizure like activity on assessment, but will hook up to cEEG r/o nonconvulsive seizure activity. he appears very chonically ill, and now  acutely ill with extensive cachexia. Given her BMI of 15, she is obviously FTT with protein-calorie malnutrition.   Impression: -Seizure at OSH.  -non adherence with Keppra.  -poly substance abuse.  -ETOH abuse, ? cessation.   Recommendations/Plan:  - S/p LEV 2g load f/b 1g q 12 hrs (pt is 36.6 kg) - cEEG - Continue sedation with versed overnight pending EEG findings - MRI brain wwo -Given her history of cirrhosis and other comorbities, as well as cachexia with PTM, would consider Palliative care consult.  -spoke with husband, and he has never had a converation with patient about end of life issues or resuscitation. So, there for, she will remain a full code for now. I did explain that she is very acutely ill and may not recover fully and her quality of life will likely be less than her baseline even 2 weeks ago at home.   Pt seen by Clance Boll, NP/Neuro and later by MD.  Pager: [phone]  Neurology Attending Attestation   I examined the patient and discussed plan with NP Ms. Lionel December. Above note has been edited by me to reflect my findings and recommendations.    This patient is critically ill and at significant risk of neurological worsening, death and care requires constant monitoring of vital signs, hemodynamics,respiratory and cardiac monitoring, neurological assessment, discussion with family, other specialists and medical decision making of high complexity. I spent 60 minutes of neurocritical care time  in the care of  this patient. This was time spent independent of any time provided by nurse practitioner or PA.   Su Monks, MD [facility] [phone]   If 7pm- 7am, please page neurology on call as listed in [place].

## 2020-09-30 NOTE — Progress Notes (Signed)
25 mL of Versed from [place] transfer wasted with Kristeen Miss RN

## 2020-09-30 NOTE — Progress Notes (Signed)
Brief Nutrition Note  Consult received for enteral/tube feeding initiation and management.  Patient is currently intubated on ventilator support MV: 5.8L/min Temp (24hrs), Avg:98 F (36.7 C), Min:98 F (36.7 C), Max:98 F (36.7 C)  Adult Enteral Nutrition Protocol initiated. Full assessment to follow.  Recommend the following: Osmolite 1.5 at 40 ml/h (960 ml per day) via OGT Free water: 2mL q4h Provides 1440 kcal, 60 gm protein, 870 ml free water daily (TF+flush)  Admitting Dx: Seizure (HCC) [R56.9]  Body mass index is 15.76 kg/m. Pt meets criteria for underweight based on current BMI.  Labs: Recent Labs  Lab 09/30/20 1459 09/30/20 1550  NA 139 138  K 3.0* 3.3*  CL  --  102  CO2  --  27  BUN  --  9  CREATININE  --  0.57  CALCIUM  --  8.6*  MG  --  1.8  PHOS  --  2.6  GLUCOSE  --  87    Greig Castilla, RD, LDN Clinical Dietitian Pager on Amion

## 2020-09-30 NOTE — Procedures (Addendum)
Patient Name: Ann Wall  MRN: [id number]  Epilepsy Attending: Charlsie Quest  Referring Physician/Provider: Jimmye Norman, NP Date: 09/30/2020 Duration: 23.19 mins  Patient history: 60yo F with seizure like activity. EEG to evaluate for seizure  Level of alertness:  lethargic  AEDs during EEG study: LEV  Technical aspects: This EEG study was done with scalp electrodes positioned according to the 10-20 International system of electrode placement. Electrical activity was acquired at a sampling rate of 500Hz  and reviewed with a high frequency filter of 70Hz  and a low frequency filter of 1Hz . EEG data were recorded continuously and digitally stored.   Description: No posterior dominant rhythm was seen. EEG showed continuous generalized polymorphic and lateralized right hemisphere 3 to 6 Hz theta-delta slowing admixed with excessive amount of 15 to 18 Hz beta activity distributed symmetrically and diffusely. Hyperventilation and photic stimulation were not performed.     ABNORMALITY - Continuous slow, generalized and lateralized right hemisphere - Excessive beta, generalized  IMPRESSION: This study is suggestive of ortical dysfunction arising from right hemisphere likely secondary to underlying structural abnormality. There is also moderate to severe diffuse encephalopathy, nonspecific etiology, No seizures or epileptiform discharges were seen throughout the recording.  Fayne Mcguffee 

## 2020-09-30 NOTE — H&P (Signed)
NAME:  Ann Wall, MRN:  [id number], DOB:  [birth year], LOS: 0 ADMISSION DATE:  09/30/2020, CONSULTATION DATE:  09/30/2020 REFERRING MD:  [facility], CHIEF COMPLAINT:  Seizures    History of Present Illness:  Ann Wall is a 60 y.o. who presented as transfer from [facility] with a history of alcoholic cirrhosis with large volume ascites (patient reports stopping alcohol consumption 9 months ago), multiple prior paracenteses, hyperlipidemia, hypertension, seizures on Keppra, anxiety and depression with worsening altered mental status.  Per H&P and oriented patient was recently admitted for decompensated cirrhosis and on arrival home family states patient was acting strange.  While in the emergency room at [facility] patient was seen with worsening altered mental status and concern for new onset seizures.  Seizure activity ceased with 1 mg of lorazepam.  Head CT at [facility] with no acute abnormalities.  Lab work at [facility] revealed: WBC 8.3, hemoglobin 14.4, platelet 385, NA 130 6K4.4 CL 97 anion gap 24 creatinine 0.40 mildly elevated LFTs  per chart review she was admitted to hospitalist services there. There is no documentation within patient's medical chart from [place] as to events leading to intubation.  Per verbal report from [place] provider patient was experiencing persistent seizure activity despite benzodiazepine administration and Keppra therefore patient was endotracheally intubated and placed on Versed drip.  Given lack of ability for continuous EEG patient was transferred to [facility] for further evaluation.  Pertinent  Medical History  Cirrhosis secondary to alcohol abuse Recurrent ascites requiring large-volume paracenteses Hypertension Hyperlipidemia Anxiety Depression Seizures on Keppra  Significant Hospital Events: Including procedures, antibiotic start and stop dates in addition to other pertinent events   6/12 accepted in transfer from [place] to [facility] for seizure activity requiring EEG and neurology consult  Interim History / Subjective:  As above  Objective   There were no vitals taken for this visit.       No intake or output data in the 24 hours ending 09/30/20 1358 There were no vitals filed for this visit.  Examination: General: Very thin cachectic middle-aged female lying in bed sedated on mechanical ventilation in no acute distress HEENT: ETT, MM pink/moist, PERRL, temporal wasting Neuro: Sedated on ventilator, unresponsive CV: s1s2 regular rate and rhythm, no murmur, rubs, or gallops,  PULM: Clear to auscultation bilaterally, no increased work of breathing, tolerating ventilator GI: soft, bowel sounds active in all 4 quadrants, non-tender, non-distended Extremities: warm/dry, no edema  Skin: no rashes or lesions   Labs/imaging that I havepersonally reviewed    Chest x-ray and head CT from outside hospital negative for acute abnormalities  Resolved Hospital Problem list     Assessment & Plan:  Decompensated alcoholic cirrhosis Portal hypertension and recurrent symptomatic large volume ascites -Patient is being followed by interventional radiology for recurrent paracentesis of 5 to 10 L removed every other week.  Had been considered for possible TIPS procedure -Na-MELD score 09/15/2020 with interventional radiology was 7 P: Obtain liver enzyme If fluid pocket present get diagnostic paracentesis to rule out BSP Empiric antibiotics  Avoid hepatotoxins  Seizures with known history of seizures on Keppra  P: Management per neurology  Maintain neuro protective measures; goal for eurothermia, euglycemia, eunatermia, normoxia, and PCO2 goal of 35-40 Nutrition and bowel regiment  Seizure precautions  AEDs per neurology  Aspirations precautions  Obtain spot EEG   Acute Hypoxic Respiratory Failure  -Intubated for airway protection due to recurrent seizures  P: Continue ventilator support with lung protective  strategies  Wean PEEP and FiO2 for sats greater than 90%. Head of bed elevated 30 degrees. Plateau pressures less than 30 cm H20.  Follow intermittent chest x-ray and ABG.   SAT/SBT as tolerated, mentation preclude extubation  Ensure adequate pulmonary hygiene  Follow cultures  VAP bundle in place  PAD protocol  Failure to thrive Severe protein calorie malnutrition P: Start tube feeds Protein supplementation  Monitor for refeeding syndrome    Remains assessment and plan pending further workup   Best practice   Diet:  Tube Feed  Pain/Anxiety/Delirium protocol (if indicated): Yes (RASS goal -1) VAP protocol (if indicated): Yes DVT prophylaxis: SCD GI prophylaxis: PPI Glucose control:  SSI Yes Central venous access:  N/A Arterial line:  N/A Foley:  N/A Mobility:  bed rest  PT consulted: N/A Last date of multidisciplinary goals of care discussion Pending  Code Status:  full code Disposition: ICU  Labs   CBC: No results for input(s): WBC, NEUTROABS, HGB, HCT, MCV, PLT in the last 168 hours.  Basic Metabolic Panel: No results for input(s): NA, K, CL, CO2, GLUCOSE, BUN, CREATININE, CALCIUM, MG, PHOS in the last 168 hours. GFR: CrCl cannot be calculated (Patient's most recent lab result is older than the maximum 21 days allowed.). No results for input(s): PROCALCITON, WBC, LATICACIDVEN in the last 168 hours.  Liver Function Tests: No results for input(s): AST, ALT, ALKPHOS, BILITOT, PROT, ALBUMIN in the last 168 hours. No results for input(s): LIPASE, AMYLASE in the last 168 hours. No results for input(s): AMMONIA in the last 168 hours.  ABG No results found for: PHART, PCO2ART, PO2ART, HCO3, TCO2, ACIDBASEDEF, O2SAT   Coagulation Profile: No results for input(s): INR, PROTIME in the last 168 hours.  Cardiac Enzymes: No results for input(s): CKTOTAL, CKMB, CKMBINDEX, TROPONINI in the last 168 hours.  HbA1C: No results found for: HGBA1C  CBG: No results for  input(s): GLUCAP in the last 168 hours.  Review of Systems:   Unable to assess due to intubation   Past Medical History:  She,  has a past medical history of Alcoholic cirrhosis (HCC), Ankle fracture, left, Arrhythmia, Ascites, Cervical spondylosis without myelopathy (08/30/2012), Chronic liver disease, Degenerative arthritis, Depression with anxiety, High cholesterol, Hypertension, Hypothyroidism, Lumbosacral spondylosis without myelopathy (08/30/2012), Pelvic fracture (HCC), Seizure (HCC), and Vitamin D deficiency.   Surgical History:   Past Surgical History:  Procedure Laterality Date   CERVICAL SPINE SURGERY     CESAREAN SECTION  [phone]   GALLBLADDER SURGERY     IR PARACENTESIS  08/29/2019   IR RADIOLOGIST EVAL & MGMT  09/11/2020   LUMBAR SPINE SURGERY  2009   REPLACEMENT TOTAL KNEE     x2   WRIST SURGERY Right      Social History:   reports that she has been smoking cigarettes. She has been smoking an average of 0.50 packs per day. She has never used smokeless tobacco. She reports that she does not drink alcohol and does not use drugs.   Family History:  Her family history includes Cancer in her maternal aunt; Colon polyps in her father; Diabetes in her father; High Cholesterol in her father and mother; Hypertension in her father, mother, paternal aunt, and paternal uncle; Stroke in her father, paternal aunt, and paternal uncle. There is no history of Colon cancer or Esophageal cancer.   Allergies Allergies  Allergen Reactions   Penicillins      Home Medications  Prior to Admission medications   Medication Sig Start Date  End Date Taking? Authorizing Provider  ALPRAZolam Prudy Feeler) 1 MG tablet Take 1 mg by mouth 4 (four) times daily. Patient not taking: Reported on 09/03/2020    [provider]  furosemide (LASIX) 40 MG tablet Take 40 mg by mouth daily.    [provider]  lactulose (CHRONULAC) 10 GM/15ML solution TAKE 1 TABLESPOON ( ) BY MOUTH TWICE  DAILY FOR 30 DAYS 11/29/19   Lynann Bologna, MD  levETIRAcetam (KEPPRA) 750 MG tablet Take 750 mg by mouth 2 (two) times daily.    [provider]  levothyroxine (SYNTHROID, LEVOTHROID) 25 MCG tablet Take 25 mcg by mouth daily before breakfast.    [provider]  oxyCODONE (ROXICODONE) 15 MG immediate release tablet Take 15 mg by mouth QID.    [provider]  POTASSIUM PO Take 1 tablet by mouth 2 (two) times daily.    [provider]  primidone (MYSOLINE) 50 MG tablet Take 100 mg by mouth daily.    [provider]  propranolol (INDERAL) 10 MG tablet Take 10 mg by mouth 2 (two) times daily. 07/31/20   [provider]  spironolactone (ALDACTONE) 50 MG tablet Take 1 tablet (50 mg total) by mouth daily. 09/03/20   Lynann Bologna, MD     Critical care time:   CRITICAL CARE Performed by: Delfin Gant  Total critical care time: 45 minutes  Critical care time was exclusive of separately billable procedures and treating other patients.  Critical care was necessary to treat or prevent imminent or life-threatening deterioration.  Critical care was time spent personally by me on the following activities: development of treatment plan with patient and/or surrogate as well as nursing, discussions with consultants, evaluation of patient's response to treatment, examination of patient, obtaining history from patient or surrogate, ordering and performing treatments and interventions, ordering and review of laboratory studies, ordering and review of radiographic studies, pulse oximetry and re-evaluation of patient's condition.  Delfin Gant, NP-C [facility] Personal contact information can be found on Amion  09/30/2020, 3:12 PM

## 2020-09-30 NOTE — Progress Notes (Signed)
LTM EEG hooked up and running - no initial skin breakdown - push button tested - neuro notified. [facility] monitoring.  

## 2020-09-30 NOTE — Progress Notes (Signed)
EEG complete - results pending 

## 2020-10-01 ENCOUNTER — Inpatient Hospital Stay (HOSPITAL_COMMUNITY): Payer: Medicaid Other

## 2020-10-01 ENCOUNTER — Inpatient Hospital Stay (HOSPITAL_COMMUNITY)
Admission: RE | Admit: 2020-10-01 | Discharge: 2020-10-01 | Disposition: A | Discharge status: Home / Self Care | Payer: Medicaid Other | Source: Ambulatory Visit | Attending: Gastroenterology | Admitting: Gastroenterology

## 2020-10-01 DIAGNOSIS — R012 Other cardiac sounds: Secondary | ICD-10-CM

## 2020-10-01 DIAGNOSIS — E876 Hypokalemia: Secondary | ICD-10-CM

## 2020-10-01 DIAGNOSIS — Z515 Encounter for palliative care: Secondary | ICD-10-CM

## 2020-10-01 DIAGNOSIS — G40001 Localization-related (focal) (partial) idiopathic epilepsy and epileptic syndromes with seizures of localized onset, not intractable, with status epilepticus: Secondary | ICD-10-CM

## 2020-10-01 DIAGNOSIS — Z66 Do not resuscitate: Secondary | ICD-10-CM

## 2020-10-01 LAB — BASIC METABOLIC PANEL
Anion gap: 5 (ref 5–15)
BUN: 9 mg/dL (ref 6–20)
CO2: 27 mmol/L (ref 22–32)
Calcium: 8.2 mg/dL — ABNORMAL LOW (ref 8.9–10.3)
Chloride: 104 mmol/L (ref 98–111)
Creatinine, Ser: 0.51 mg/dL (ref 0.44–1.00)
GFR, Estimated: >60 mL/min (ref >60)
Glucose, Bld: 120 mg/dL — ABNORMAL HIGH (ref 70–99)
Potassium: 3.1 mmol/L — ABNORMAL LOW (ref 3.5–5.1)
Sodium: 136 mmol/L (ref 135–145)

## 2020-10-01 LAB — CBC
HCT: 34.9 % — ABNORMAL LOW (ref 36.0–46.0)
Hemoglobin: 11.6 g/dL — ABNORMAL LOW (ref 12.0–15.0)
MCH: 29.4 pg (ref 26.0–34.0)
MCHC: 33.2 g/dL (ref 30.0–36.0)
MCV: 88.4 fL (ref 80.0–100.0)
Platelets: 167 10*3/uL (ref 150–400)
RBC: 3.95 MIL/uL (ref 3.87–5.11)
RDW: 15.7 % — ABNORMAL HIGH (ref 11.5–15.5)
WBC: 4.8 10*3/uL (ref 4.0–10.5)
nRBC: 0 % (ref 0.0–0.2)

## 2020-10-01 LAB — ECHOCARDIOGRAM COMPLETE
Area-P 1/2: 2.75 cm2
Height: 60 in
S' Lateral: 2.2 cm
Weight: 1291.01 oz

## 2020-10-01 LAB — GLUCOSE, CAPILLARY
Glucose-Capillary: 115 mg/dL — ABNORMAL HIGH (ref 70–99)
Glucose-Capillary: 133 mg/dL — ABNORMAL HIGH (ref 70–99)
Glucose-Capillary: 197 mg/dL — ABNORMAL HIGH (ref 70–99)
Glucose-Capillary: 199 mg/dL — ABNORMAL HIGH (ref 70–99)
Glucose-Capillary: 161 mg/dL — ABNORMAL HIGH (ref 70–99)

## 2020-10-01 LAB — PHOSPHORUS: Phosphorus: 2.3 mg/dL — ABNORMAL LOW (ref 2.5–4.6)

## 2020-10-01 LAB — TSH: TSH: 2.961 u[IU]/mL (ref 0.350–4.500)

## 2020-10-01 LAB — MAGNESIUM: Magnesium: 1.7 mg/dL (ref 1.7–2.4)

## 2020-10-01 MED ORDER — ACETAMINOPHEN 650 MG RE SUPP: 650.0000 mg | Freq: Four times a day (QID) | RECTAL | Status: DC | PRN

## 2020-10-01 MED ORDER — HALOPERIDOL 0.5 MG PO TABS
0.5000 mg | ORAL_TABLET | ORAL | Status: DC | PRN
  Filled 2020-10-01: qty 1

## 2020-10-01 MED ORDER — ACETAMINOPHEN 325 MG PO TABS: 650.0000 mg | ORAL_TABLET | Freq: Four times a day (QID) | ORAL | Status: DC | PRN

## 2020-10-01 MED ORDER — GLYCOPYRROLATE 0.2 MG/ML IJ SOLN
0.2000 mg | INTRAMUSCULAR | Status: DC | PRN
  Administered 2020-10-01: 0.2 mg via INTRAVENOUS
  Filled 2020-10-01: qty 1

## 2020-10-01 MED ORDER — GLYCOPYRROLATE 1 MG PO TABS
1.0000 mg | ORAL_TABLET | ORAL | Status: DC | PRN
  Filled 2020-10-01: qty 1

## 2020-10-01 MED ORDER — THIAMINE HCL 100 MG PO TABS
100.0000 mg | ORAL_TABLET | Freq: Every day | ORAL | Status: DC
  Administered 2020-10-01: 100 mg
  Filled 2020-10-01: qty 1

## 2020-10-01 MED ORDER — MAGNESIUM SULFATE IN D5W 1-5 GM/100ML-% IV SOLN
1.0000 g | Freq: Once | INTRAVENOUS | Status: AC
  Administered 2020-10-01: 1 g via INTRAVENOUS
  Filled 2020-10-01: qty 100

## 2020-10-01 MED ORDER — ONDANSETRON 4 MG PO TBDP: 4.0000 mg | ORAL_TABLET | Freq: Four times a day (QID) | ORAL | Status: DC | PRN

## 2020-10-01 MED ORDER — LEVOTHYROXINE SODIUM 25 MCG PO TABS
25.0000 ug | ORAL_TABLET | Freq: Every day | ORAL | Status: DC
  Administered 2020-10-01: 25 ug
  Filled 2020-10-01: qty 1

## 2020-10-01 MED ORDER — HYDROMORPHONE HCL 1 MG/ML IJ SOLN
1.0000 mg | Freq: Four times a day (QID) | INTRAMUSCULAR | Status: DC
  Administered 2020-10-01 – 2020-10-02 (×2): 1 mg via INTRAVENOUS
  Filled 2020-10-01 (×2): qty 1

## 2020-10-01 MED ORDER — K PHOS MONO-SOD PHOS DI & MONO 155-852-130 MG PO TABS
500.0000 mg | ORAL_TABLET | Freq: Two times a day (BID) | ORAL | Status: DC
  Filled 2020-10-01: qty 2

## 2020-10-01 MED ORDER — LORAZEPAM 2 MG/ML IJ SOLN
1.0000 mg | Freq: Four times a day (QID) | INTRAMUSCULAR | Status: DC
  Administered 2020-10-02 (×2): 1 mg via INTRAVENOUS
  Filled 2020-10-01 (×3): qty 1

## 2020-10-01 MED ORDER — GLYCOPYRROLATE 0.2 MG/ML IJ SOLN: 0.2000 mg | INTRAMUSCULAR | Status: DC | PRN

## 2020-10-01 MED ORDER — POLYVINYL ALCOHOL 1.4 % OP SOLN
1.0000 [drp] | Freq: Four times a day (QID) | OPHTHALMIC | Status: DC | PRN
  Filled 2020-10-01: qty 15

## 2020-10-01 MED ORDER — LORAZEPAM 2 MG/ML IJ SOLN
0.5000 mg | INTRAMUSCULAR | Status: DC | PRN
  Administered 2020-10-02: 1 mg via INTRAVENOUS
  Filled 2020-10-01: qty 1

## 2020-10-01 MED ORDER — FOLIC ACID 1 MG PO TABS
1.0000 mg | ORAL_TABLET | Freq: Every day | ORAL | Status: DC
  Administered 2020-10-01: 1 mg
  Filled 2020-10-01: qty 1

## 2020-10-01 MED ORDER — ONDANSETRON HCL 4 MG/2ML IJ SOLN: 4.0000 mg | Freq: Four times a day (QID) | INTRAMUSCULAR | Status: DC | PRN

## 2020-10-01 MED ORDER — GADOBUTROL 1 MMOL/ML IV SOLN
3.5000 mL | Freq: Once | INTRAVENOUS | Status: AC | PRN
  Administered 2020-10-01: 3.5 mL via INTRAVENOUS

## 2020-10-01 MED ORDER — BIOTENE DRY MOUTH MT LIQD: 15.0000 mL | OROMUCOSAL | Status: DC | PRN

## 2020-10-01 MED ORDER — HALOPERIDOL LACTATE 2 MG/ML PO CONC
0.5000 mg | ORAL | Status: DC | PRN
  Filled 2020-10-01: qty 0.3

## 2020-10-01 MED ORDER — ADULT MULTIVITAMIN W/MINERALS CH: 1.0000 | ORAL_TABLET | Freq: Every day | ORAL | Status: DC

## 2020-10-01 MED ORDER — POTASSIUM CHLORIDE 20 MEQ PO PACK
40.0000 meq | PACK | Freq: Once | ORAL | Status: AC
  Administered 2020-10-01: 40 meq
  Filled 2020-10-01: qty 2

## 2020-10-01 MED ORDER — HEPARIN SODIUM (PORCINE) 5000 UNIT/ML IJ SOLN
5000.0000 [IU] | Freq: Three times a day (TID) | INTRAMUSCULAR | Status: DC
  Administered 2020-10-01: 5000 [IU] via SUBCUTANEOUS
  Filled 2020-10-01: qty 1

## 2020-10-01 MED ORDER — K PHOS MONO-SOD PHOS DI & MONO 155-852-130 MG PO TABS
500.0000 mg | ORAL_TABLET | Freq: Two times a day (BID) | ORAL | Status: AC
  Administered 2020-10-01: 500 mg
  Filled 2020-10-01: qty 2

## 2020-10-01 MED ORDER — HYDROMORPHONE HCL 1 MG/ML IJ SOLN
1.0000 mg | INTRAMUSCULAR | Status: DC | PRN
  Administered 2020-10-01 – 2020-10-02 (×4): 1 mg via INTRAVENOUS
  Filled 2020-10-01 (×4): qty 1

## 2020-10-01 MED ORDER — ADULT MULTIVITAMIN LIQUID CH
15.0000 mL | Freq: Every day | ORAL | Status: DC
  Administered 2020-10-01: 15 mL
  Filled 2020-10-01: qty 15

## 2020-10-01 MED ORDER — HALOPERIDOL LACTATE 5 MG/ML IJ SOLN: 0.5000 mg | INTRAMUSCULAR | Status: DC | PRN

## 2020-10-01 MED ORDER — OSMOLITE 1.2 CAL PO LIQD: 1000.0000 mL | ORAL | Status: DC

## 2020-10-01 MED ORDER — INSULIN ASPART 100 UNIT/ML IJ SOLN
0.0000 [IU] | INTRAMUSCULAR | Status: DC
  Administered 2020-10-01 (×2): 2 [IU] via SUBCUTANEOUS

## 2020-10-01 MED ORDER — PROSOURCE TF PO LIQD
45.0000 mL | Freq: Every day | ORAL | Status: DC
  Administered 2020-10-01: 45 mL
  Filled 2020-10-01: qty 45

## 2020-10-01 NOTE — Progress Notes (Signed)
Ventilator pt transported from 4N29 to MRI and back without any complications.

## 2020-10-01 NOTE — Progress Notes (Signed)
NAME:  Ann Wall, MRN:  [id number], DOB:  [birth year], LOS: 1 ADMISSION DATE:  09/30/2020, CONSULTATION DATE:  09/30/2020 REFERRING MD:  [facility], CHIEF COMPLAINT:  Seizures    History of Present Illness:  Ann Wall is a 60 y.o. who presented as transfer from [facility] health with a history of alcoholic cirrhosis with large volume ascites (patient reports stopping alcohol consumption 9 months ago), multiple prior paracenteses, hyperlipidemia, hypertension, seizures on Keppra, anxiety and depression with worsening altered mental status.  Per H&P and oriented patient was recently admitted for decompensated cirrhosis and on arrival home family states patient was acting strange.  While in the emergency room at [facility] patient was seen with worsening altered mental status and concern for new onset seizures.  Seizure activity ceased with 1 mg of lorazepam.  Head CT at [facility] with no acute abnormalities.  Lab work at [facility] revealed: WBC 8.3, hemoglobin 14.4, platelet 385, NA 130 6K4.4 CL 97 anion gap 24 creatinine 0.40 mildly elevated LFTs  per chart review she was admitted to hospitalist services there. There is no documentation within patient's medical chart from [place] as to events leading to intubation.  Per verbal report from [place] provider patient was experiencing persistent seizure activity despite benzodiazepine administration and Keppra therefore patient was endotracheally intubated and placed on Versed drip.  Given lack of ability for continuous EEG patient was transferred to [facility] for further evaluation.  Pertinent  Medical History  Cirrhosis secondary to alcohol abuse Recurrent ascites requiring large-volume paracenteses Hypertension Hyperlipidemia Anxiety Depression Seizures on Keppra  Significant Hospital Events: Including procedures, antibiotic start and stop dates in addition to other pertinent events   6/12 accepted in transfer from [place] to [facility] for seizure activity requiring EEG and neurology consult ECHO LVEF 65 to 70%, RVSF normal, Trivial MR, AV not well visualized  Interim History / Subjective:  Admit  Tmax 98.7  +514 ml admit/24hr, 400 UOP  BG 85-135  Unable to obtain subjective evaluation due to patient status  Objective   Blood pressure (!) 90/57, pulse 66, temperature 98.3 F (36.8 C), temperature source Oral, resp. rate 17, height 5' (1.524 m), weight 36.6 kg, SpO2 96 %.    Vent Mode: PRVC FiO2 (%):  [35 %] 35 % Set Rate:  [14 bmp] 14 bmp Vt Set:  [360 mL] 360 mL PEEP:  [5 cmH20] 5 cmH20 Plateau Pressure:  [12 cmH20-15 cmH20] 13 cmH20   Intake/Output Summary (Last 24 hours) at 10/01/2020 6712 Last data filed at 10/01/2020 0600 Gross per 24 hour  Intake 914.07 ml  Output 400 ml  Net 514.07 ml   Filed Weights   09/30/20 1444  Weight: 36.6 kg    Examination: General:  in bed, cachectic, ill appearing  HEENT: MM pink/moist, anicteric, trachea midline, ETT/OGT  Neuro: GCS 10t, localizes, RASS -1, PERRL 62mm CV: S1S2, NSR , no m/r/g appreciated PULM:  clear in the upper lobes and in the lower lobes, scant secretions, chest expansion symmetric GI: soft, bsx4 active, rounded Extremities: warm/dry, no pretibial edema, capillary refill less than 3 seconds  Skin: no rashes or lesions noted  Labs/imaging that I havepersonally reviewed    CMP-hypokalemia, AST 22, ALT 13 MG- low Phos- low CBC CXR- Stable lines, hyperinflation ECHO-ECHO LVEF 65 to 70%, RVSF normal, Trivial MR, AV not well visualized Resolved Hospital Problem list     Assessment & Plan:  Decompensated alcoholic cirrhosis Portal hypertension and recurrent symptomatic large volume ascites -Patient is  being followed by interventional radiology for recurrent paracentesis of 5 to 10 L removed every other week.  Had been considered for possible TIPS procedure -Na-MELD score 09/15/2020 with interventional radiology was 7 P: -Continue to  monitor LFT's -Continue to monitor for need to complete paracentesis. Abdomen rounded, soft, peak pressures low. -Avoid hepatotoxins -PC consult  Seizures with known history of seizures on Keppra Acute Metabolic Encephalopathy ? Secondary to AED noncompliance vs ETOH withdraw. Reported altered for 5 days. ?ETOH intake at that time. Ammonia 28.  No seizures on EEG. P: -Management and workup per neurology. Appreciate assistance -EEG ongoing. -On keppra for seizure ppx.  -Aspiration precautions -MRI scheduled for today. Follow up. -Check TSH -Starting thiamine, folate, and multivitamin replacement per tube  Acute Hypoxic Respiratory Failure  -Intubated for airway protection due to recurrent seizures  P: -LTVV strategy with tidal volumes of 4-8 cc/kg ideal body weight -Goal plateau pressures of 30 and driving pressures of 15 -Wean PEEP/FiO2 for SpO2 92-98 -Follow intermittent CXR and ABG PRN -Stop precedex. Continue fentanyl for PAD. Goal RASS 0 CPOT 0-2. Titrate to goal -Tolerating SAT/SBT. Hope to extubate today.  Electrolyte Abnormalities- Hypokalemia, Hypomagnesia, Hypophosphatemia P: -Replete -Follow Up on AM labs  Failure to thrive Severe protein calorie malnutrition P: -Continue tube feeds at goal -Appreciate PC assistance.  Alcohol Use Disorder Unclear when last drink was -Montior for s/s of etoh withdraw -Was previously on benzos. Monitor for s/s of withdrawal. No currently on CIWA  Best practice   Diet:  Tube Feed  Pain/Anxiety/Delirium protocol (if indicated): Yes (RASS goal 0) VAP protocol (if indicated): Yes DVT prophylaxis: Subcutaneous Heparin GI prophylaxis: PPI Glucose control:  SSI Yes Central venous access:  N/A Arterial line:  N/A Foley:  N/A Mobility:  bed rest  PT consulted: Yes Last date of multidisciplinary goals of care discussion: Pending Code Status:  full code Disposition: ICU   Critical care time:   CRITICAL CARE Performed by:  Eliezer Champagne  Total critical care time: 31 minutes  Critical care time was exclusive of separately billable procedures and treating other patients.  Critical care was necessary to treat or prevent imminent or life-threatening deterioration.  Critical care was time spent personally by me on the following activities: development of treatment plan with patient and/or surrogate as well as nursing, discussions with consultants, evaluation of patient's response to treatment, examination of patient, obtaining history from patient or surrogate, ordering and performing treatments and interventions, ordering and review of laboratory studies, ordering and review of radiographic studies, pulse oximetry and re-evaluation of patient's condition.  Gershon Mussel., MSN, APRN, AGACNP-BC [place] [facility]  10/01/2020 , 8:29 AM  Please see [email or web address] for pager details  If no response, please call [phone] After hours, please call Elink at [phone]

## 2020-10-01 NOTE — Progress Notes (Signed)
Per Chesea in EEG, this pt's EEG leads ARE MRI compatible. Will facilitate MRI when safest for pt.

## 2020-10-01 NOTE — Procedures (Signed)
Extubation Procedure Note  Patient Details:   Name: Ann Wall DOB: Feb 22, 1961 MRN: [id number]   Airway Documentation:    Vent end date: 10/01/20 Vent end time: 1839   Evaluation  O2 sats: currently acceptable Complications: No apparent complications Patient did tolerate procedure well. Bilateral Breath Sounds: Diminished, Clear   No  Haylen Bellotti 10/01/2020, 6:40 PM

## 2020-10-01 NOTE — Progress Notes (Signed)
Initial Nutrition Assessment  DOCUMENTATION CODES:   Underweight  INTERVENTION:   Consider Cortrak tube placement for nutrition prior to extubation  Tube Feeding via OG:  Osmolite 1.2 at 50 ml/hr Pro-Source TF 45 mL daily Provides 78 g of protein, 1480 kcals and 972 mL  Change liquid MVI to MVI with Minerals   NUTRITION DIAGNOSIS:   Inadequate oral intake related to acute illness as evidenced by NPO status.  GOAL:   Patient will meet greater than or equal to 90% of their needs  MONITOR:   Vent status, TF tolerance, Labs, Weight trends  REASON FOR ASSESSMENT:   Ventilator, Consult Enteral/tube feeding initiation and management  ASSESSMENT:   60 yo female admitted with AMS and possible seizures, with decompensated cirrhosis, acute respiratory failure requiring intubation. PMH includes EtOH cirrhosis, ascites requiring recurrent large-volume paracentesis, HTN, HLD, depression  Pt currently sedated on vent support; out of room on visit today for MRI  Currently on vent support, mental status inhibiting extubation Tolerating Osmolite 1.5 at 40 ml/hr  Noted pt is requiring paracentesis every other week with 5-10 L removed  Current wt 36.6 kg; unsure of actual dry weight given ascites  Unable to perform Nutrition Focused Physical Exam upon assessment today as pt out of room for test but suspect pt with severe malnutrition. Plan to further assess on follow-up  Labs: potassium 3.1 (L), phosphorus 2.3 (L) Meds: colace, ss novolog, mag sulfate, liquid MVI, K phos, thiamine   Diet Order:   Diet Order             Diet NPO time specified  Diet effective now                   EDUCATION NEEDS:   Not appropriate for education at this time  Skin:  Skin Assessment: Skin Integrity Issues: Skin Integrity Issues:: Stage III Stage III: hip  Last BM:  PTA  Height:   Ht Readings from Last 1 Encounters:  09/30/20 5' (1.524 m)    Weight:   Wt Readings from  Last 1 Encounters:  09/30/20 36.6 kg    BMI:  Body mass index is 15.76 kg/m.  Estimated Nutritional Needs:   Kcal:  1450-1650  Protein:  75-85 g  Fluid:  1.2-1.5 L/d    Romelle Starcher MS, RDN, LDN, CNSC Registered Dietitian III Clinical Nutrition RD Pager and On-Call Pager Number Located in [place]

## 2020-10-01 NOTE — Procedures (Signed)
Patient Name: Ann Wall  MRN: [id number]  Epilepsy Attending: Charlsie Quest  Referring Physician/Provider: Dr Bing Neighbors Duration: 09/30/2020 1737 to 10/01/2020 1640   Patient history: 60yo F with seizure like activity. EEG to evaluate for seizure   Level of alertness:  lethargic   AEDs during EEG study: LEV   Technical aspects: This EEG study was done with scalp electrodes positioned according to the 10-20 International system of electrode placement. Electrical activity was acquired at a sampling rate of 500Hz  and reviewed with a high frequency filter of 70Hz  and a low frequency filter of 1Hz . EEG data were recorded continuously and digitally stored.   Description: The posterior dominant rhythm consists of 9 Hz activity of moderate voltage (25-35 uV) seen predominantly in posterior head regions, symmetric and reactive to eye opening and eye closing. Sleep was characterized by vertex waves, sleep spindles (12 to 14 Hz), asymmetric left frontocentral region. EEG showed continuous generalized and lateralized right hemisphere 3 to 6 Hz theta-delta slowing which at times appeared rhythmic without definite evolution. Hyperventilation and photic stimulation were not performed.      ABNORMALITY - Continuous slow, generalized and lateralized right hemisphere - Spindle asymmetry, right<left   IMPRESSION: This study is suggestive of cortical dysfunction arising from right hemisphere likely secondary to underlying structural abnormality. There is also moderate diffuse encephalopathy, nonspecific etiology, No seizures or epileptiform discharges were seen throughout the recording.   Rimsha Trembley 

## 2020-10-01 NOTE — Progress Notes (Signed)
   10/01/20 1725  Clinical Encounter Type  Visited With Patient and family together  Visit Type Patient actively dying  Referral From Nurse  Consult/Referral To Chaplain  The patient's sister Neysa Bonito and the patient's mother, Bonita Quin, were at the bedside. Neysa Bonito stated another sister is coming from [place], and the patient's daughters are on the way. Neysa Bonito stated that the patient loves the Lord. She asked the patient to blink if she wanted prayer, and Ms. Pocahontas blinked. This chaplain prayed with the family and offered the ministry of encouraging words to the patient and family.  This note was prepared by Deneen Harts, M.Div..  For questions please contact by phone [phone].

## 2020-10-01 NOTE — Progress Notes (Signed)
  Echocardiogram 2D Echocardiogram has been performed.  Augustine Radar 10/01/2020, 9:59 AM

## 2020-10-01 NOTE — Progress Notes (Signed)
Called Meleane K Embleton's Husband Kevia Zaucha at [phone]. Unable to get in contact with Alinda Money for clinical update. Left message to call the hospital back.  Gershon Mussel., MSN, APRN, AGACNP-BC [facility] [facility]  10/01/2020 , 3:54 PM  Please see [email or web address] for pager details  If no response, please call [phone] After hours, please call Elink at [phone]

## 2020-10-01 NOTE — Progress Notes (Addendum)
Subjective: No clinical seizures overnight.  ROS: Unable to obtain due to poor mental status  Examination  Vital signs in last 24 hours: Temp:  [98.1 F (36.7 C)-98.7 F (37.1 C)] 98.2 F (36.8 C) (06/13 1200) Pulse Rate:  [63-98] 88 (06/13 1258) Resp:  [14-25] 19 (06/13 1200) BP: (77-116)/(55-71) 102/63 (06/13 1200) SpO2:  [94 %-100 %] 100 % (06/13 1258) FiO2 (%):  [35 %] 35 % (06/13 1117)  General: lying in bed, not in apparent distress CVS: pulse-normal rate and rhythm RS: Intubated, coarse breath sounds bilaterally Extremities: normal, warm Neuro: Opens eyes to repeated tactile stimuli, does not follow commands, PERRLA, no forced gaze deviation, gag reflex intact, withdraws to noxious stimuli in all 4 extremities  Basic Metabolic Panel: Recent Labs  Lab 09/30/20 1459 09/30/20 1550 10/01/20 0050  NA 139 138 136  K 3.0* 3.3* 3.1*  CL  --  102 104  CO2  --  27 27  GLUCOSE  --  87 120*  BUN  --  9 9  CREATININE  --  0.57 0.51  CALCIUM  --  8.6* 8.2*  MG  --  1.8 1.7  PHOS  --  2.6 2.3*    CBC: Recent Labs  Lab 09/30/20 1459 09/30/20 1550 10/01/20 0050  WBC  --  7.3 4.8  HGB 11.6* 11.2* 11.6*  HCT 34.0* 35.4* 34.9*  MCV  --  91.0 88.4  PLT  --  209 167     Coagulation Studies: Recent Labs    09/30/20 1550  LABPROT 14.2  INR 1.1    Imaging MRI brain with and without contrast 10/01/2020: No acute abnormality   ASSESSMENT AND PLAN: 60 year old female with history of epilepsy on Keppra who was transferred from outside hospital after a breakthrough seizure.  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  Per note, patient reported an aura of difficulty with speech followed by jerking of the left arm followed by generalized tonic-clonic seizure associated with urinary incontinence.  Of note, patient is left-handed -Etiology of breakthrough seizures: Medication noncompliance versus alcohol use versus infection (dirty UA, urine culture ordered and pending)  Recommendations -The semiology of seizures as described in Dr Karle Plumber note, left-sided weakness (Todd's paralysis) noted on the left side during this admission as well as slowing in the right hemisphere are all suggestive of right hemispheric epilepsy.  Unfortunately, I do not see Keppra levels to see if patient was compliant or if this was the etiology for breakthrough seizures.  There is also mention of alcohol use which could have contributed to breakthrough seizures.  We will obtain more history once patient is extubated -Has had no further seizures overnight, will discontinue LTM EEG -Continue Keppra 1000 mg daily - Continue seizure precautions -As needed IV Ativan 2 mg for clinical seizure-like activity  I have spent a total of  35 minutes with the patient reviewing hospital notes,  test results, labs and examining the patient as well as establishing an assessment and plan.  > 50% of time was spent in direct patient care.    Lindie Spruce Epilepsy [facility] For questions after 5pm please refer to AMION to reach the Neurologist on call

## 2020-10-01 NOTE — Progress Notes (Signed)
Pt's sister, mother and two daughters are at bedside. Per discussion with palliative care NP, family will tell me when they are ready for extubation.
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
## 2020-10-01 NOTE — Progress Notes (Signed)
Versed 2 mg given prior to MRI at about 1235.

## 2020-10-01 NOTE — TOC Initial Note (Signed)
Transition of Care [facility]) - Initial/Assessment Note    Patient Details  Name: Ann Wall MRN: [id number] Date of Birth: 15-Sep-1960  [facility]) CM/SW Contact:    Baldemar Lenis, LCSW Phone Number: 10/01/2020, 4:34 PM  Clinical Narrative:      CSW contacted by palliative NP that patient's spouse interested in transfer to hospice home in [place]. CSW contacted [facility] to provide referral, and they will follow up with patient's husband. Hopeful for bed availability tomorrow. CSW to follow.             Expected Discharge Plan: Hospice Medical Facility Barriers to Discharge: Continued Medical Work up, Hospice Bed not available   Patient Goals and CMS Choice Patient states their goals for this hospitalization and ongoing recovery are:: patient unable to participate in goal setting CMS Medicare.gov Compare Post Acute Care list provided to:: Patient Represenative (must comment) Choice offered to / list presented to : Spouse  Expected Discharge Plan and Services Expected Discharge Plan: Hospice Medical Facility     Post Acute Care Choice: Hospice                                        Prior Living Arrangements/Services   Lives with:: Spouse Patient language and need for interpreter reviewed:: No Do you feel safe going back to the place where you live?: Yes      Need for Family Participation in Patient Care: Yes (Comment) Care giver support system in place?: No (comment)   Criminal Activity/Legal Involvement Pertinent to Current Situation/Hospitalization: No - Comment as needed  Activities of Daily Living      Permission Sought/Granted Permission sought to share information with : Facility Medical sales representative, Family Supports Permission granted to share information with : Yes, Verbal Permission Granted  Share Information with NAME: Alinda Money  Permission granted to share info w AGENCY: [facility]  Permission granted  to share info w Relationship: Spouse     Emotional Assessment   Attitude/Demeanor/Rapport: Unable to Assess Affect (typically observed): Unable to Assess   Alcohol / Substance Use: Not Applicable Psych Involvement: No (comment)  Admission diagnosis:  Seizure [facility]) [R56.9] Patient Active Problem List   Diagnosis Date Noted   Seizure (HCC) 09/30/2020   Pressure injury of skin 09/30/2020   Pain in limb 08/30/2012   Cervical spondylosis without myelopathy 08/30/2012   Lumbosacral spondylosis without myelopathy 08/30/2012   PCP:  Galvin Proffer, MD Pharmacy:   [id number] - [place], [place] - [address] [place] [address] Phone: [phone] Fax: [phone]  [facility] - [place], [place] - [address] [place] [address] Phone: [phone] Fax: [phone]     Social Determinants of Health (SDOH) Interventions    Readmission Risk Interventions No flowsheet data found.

## 2020-10-01 NOTE — Progress Notes (Signed)
Family ready for extubation. RT notified.

## 2020-10-01 NOTE — Progress Notes (Signed)
Pt extubated at 1840. No distress noted. Family at bedside.

## 2020-10-02 DIAGNOSIS — G9341 Metabolic encephalopathy: Secondary | ICD-10-CM

## 2020-10-02 LAB — URINE CULTURE: Culture: 40000 — AB

## 2020-10-02 LAB — HEMOGLOBIN A1C
Hgb A1c MFr Bld: 5.2 % (ref 4.8–5.6)
Mean Plasma Glucose: 103 mg/dL

## 2020-10-02 LAB — CULTURE, RESPIRATORY W GRAM STAIN: Culture: NORMAL

## 2020-10-02 MED ORDER — LEVETIRACETAM IN NACL 1000 MG/100ML IV SOLN: 1000.0000 mg | Freq: Two times a day (BID) | INTRAVENOUS | Status: AC

## 2020-10-02 NOTE — Progress Notes (Signed)
   Referral received yesterday from [facility]. I have spoke to the pt's husband and he will be able to do paperwork at our office in [place]. He has poor health himself and has been having some trouble due to his wife was his primary caregiver. He is in agreement to hospice services and the comfort care approach. We do have a bed to offer and the pt's spouse has accepted the bed offer. He will be able to transfer today if MD feels pt is ready for d/c.   Report number for nurse to call: [phone]  Norm Parcel RN [phone]

## 2020-10-02 NOTE — Discharge Summary (Signed)
Physician Discharge Summary         Patient ID: Ann Wall MRN: [id number] DOB/AGE: 27-Jan-1961 60 y.o.  Admit date: 09/30/2020 Discharge date: 10/02/2020  Discharge Diagnoses:   Decompensated alcoholic cirrhosis Portal hypertension and recurrent symptomatic large volume ascites Seizures with known history of seizures on Keppra Acute Metabolic Encephalopathy Acute Hypoxic Respiratory Failure Electrolyte Abnormalities- Hypokalemia, Hypomagnesia, Hypophosphatemia Failure to thrive Severe protein calorie malnutrition  Discharge summary   Ann Wall is a 60 y.o. who presented as transfer from [facility] on 6/12 with a history of alcoholic cirrhosis with large volume ascites (patient reports stopping alcohol consumption 9 months ago), multiple prior paracenteses, hyperlipidemia, hypertension, seizures on Keppra, anxiety and depression with worsening altered mental status.  Per H&P and oriented patient was recently admitted for decompensated cirrhosis and on arrival home family states patient was acting strange.   While in the emergency room at [facility] patient was seen with worsening altered mental status and concern for new onset seizures.  Seizure activity ceased with 1 mg of lorazepam.  Head CT at [facility] with no acute abnormalities.  Lab work at [facility] revealed: WBC 8.3, hemoglobin 14.4, platelet 385, NA 130 6K4.4 CL 97 anion gap 24 creatinine 0.40 mildly elevated LFTs  per chart review she was admitted to hospitalist services there. There is no documentation within patient's medical chart from [place] as to events leading to intubation.  Per verbal report from [facility] provider patient was experiencing persistent seizure activity despite benzodiazepine administration and Keppra therefore patient was endotracheally intubated and placed on Versed drip.  Given lack of ability for continuous EEG patient was transferred to [facility] on 6/12 for further evaluation.  On 6/13  a MRI of the brain was obtained showing no acute intracranial abnormality. An echo was obtained showing LVEF 65 to 70%, RVSF normal, Trivial MR, AV not well visualized. No seizures were seen on EEG.  On 6/13 Palliative care was consulted and discussed the care and prognosis of Ann Wall with her husband Ann Wall. The decision was made to make Ann Wall a DNR/DNI and attempt to transfer her to hospice in [place]. She was extubated to room air on 1840.    Discharge Plan by Active Problems    Decompensated alcoholic cirrhosis Portal hypertension and recurrent symptomatic large volume ascites -Patient is being followed by interventional radiology for recurrent paracentesis of 5 to 10 L removed every other week.  Had been considered for possible TIPS procedure -Na-MELD score 09/15/2020 with interventional radiology was 7 P: -Avoid hepatotoxins -Depending on how Ann Wall progresses she can follow up with IR regarding her TIPS procedure if it is deemed beneficial for her GOC and if IR is agreeable   Seizures with known history of seizures on Keppra Acute Metabolic Encephalopathy ? Secondary to AED noncompliance. Reported altered for 5 days. . Ammonia 28.  No seizures on EEG. Reported no ETOH intake since 2018 P: -Continue keppra for seizure prophylaxis    Acute Hypoxic Respiratory Failure- Improving -Extubated 6/14, on RA P: -Pulmonary toilet and suction as tolerated/desired    Electrolyte Abnormalities- Hypokalemia, Hypomagnesia, Hypophosphatemia P: -Repleted  UTI 6/12 UC positive for lactobacillus 40,000 colonies (UC updated 6/14 at 1225)  -Asymptomatic. Not starting on ABX at this time  Failure to thrive Severe protein calorie malnutrition P: -Start mechanical soft diet for comfort.  Significant Hospital tests/ studies  See above  Procedures   N/a Culture data/antimicrobials   All cultures on 6/12 UC positive for lactobacillus 40,000  colonies (UC updated 6/14 at  1225) TA no growth Blood cultures NG for  Covid negative   Consults  Palliative Care Neurology    Discharge Exam: BP 125/70   Pulse 83   Temp 98.4 F (36.9 C) (Axillary)   Resp (!) 25   Ht 5' (1.524 m)   Wt 36.6 kg   SpO2 96%   BMI 15.76 kg/m   General:  unwell, cachectic, no acute distress  HEENT: MM pink/moist, icteric/anicteric, trachea midline  Neuro: Eyes open to voice, non verbal, localizes,  RASS -1, PERRL 35mm CV: S1S2, nsr, no m/r/g appreciated PULM:  clear in the upper lobes and in the lower lobes, scant secretions, chest expansion symmetric GI: soft, bsx4 active/hypoactive, rounded   Extremities: warm/dry, no pretibial edema, capillary refill less than 3 seconds  Skin: no rashes or lesions noted  Labs at discharge   Lab Results  Component Value Date   CREATININE 0.51 10/01/2020   BUN 9 10/01/2020   NA 136 10/01/2020   K 3.1 (L) 10/01/2020   CL 104 10/01/2020   CO2 27 10/01/2020   Lab Results  Component Value Date   WBC 4.8 10/01/2020   HGB 11.6 (L) 10/01/2020   HCT 34.9 (L) 10/01/2020   MCV 88.4 10/01/2020   PLT 167 10/01/2020   Lab Results  Component Value Date   ALT 13 09/30/2020   AST 22 09/30/2020   ALKPHOS 105 09/30/2020   BILITOT 1.2 09/30/2020   Lab Results  Component Value Date   INR 1.1 09/30/2020   INR 1.1 09/03/2020   INR 1.1 08/24/2020    Current radiological studies    MR BRAIN W WO CONTRAST  Result Date: 10/01/2020 CLINICAL DATA:  Seizure. EXAM: MRI HEAD WITHOUT AND WITH CONTRAST TECHNIQUE: Multiplanar, multiecho pulse sequences of the brain and surrounding structures were obtained without and with intravenous contrast. CONTRAST:  3.67mL GADAVIST GADOBUTROL 1 MMOL/ML IV SOLN COMPARISON:  Head CT 09/29/2020 and MRI 05/10/2019 FINDINGS: The study is intermittently mildly to moderately motion degraded. Brain: There is no evidence of an acute infarct, intracranial hemorrhage, mass, midline shift, or extra-axial fluid collection.  There is mild generalized cerebral atrophy. Small T2 hyperintensities in the cerebral white matter bilaterally are similar to the prior MRI and are nonspecific but compatible with mild chronic small vessel ischemic disease. No abnormal enhancement is identified. The mesial temporal lobe structures are symmetric in appearance within limitations of motion artifact on dedicated temporal lobe sequences. Vascular: Major intracranial vascular flow voids are preserved. Skull and upper cervical spine: Unremarkable bone marrow signal. Sinuses/Orbits: Unremarkable orbits. Paranasal sinuses and mastoid air cells are clear. Other: None. IMPRESSION: 1. No acute intracranial abnormality. 2. Mild chronic small vessel ischemic disease. Electronically Signed   By: Sebastian Ache M.D.   On: 10/01/2020 13:48   DG Chest Port 1 View  Result Date: 09/30/2020 CLINICAL DATA:  End-stage liver disease.  Seizures.  Transfer. EXAM: PORTABLE CHEST 1 VIEW COMPARISON:  Earlier today, 3:04 a.m., from [facility]. FINDINGS: 2:36 p.m. Patient rotated to the right. Endotracheal tube terminates 1.9 cm above carina. Nasogastric tube extends beyond the inferior aspect of the film. Normal heart size. No pleural effusion or pneumothorax. Hyperinflation. No lobar consolidation. Mild left hemidiaphragm elevation. IMPRESSION: Appropriate and stable position of support apparatus. Hyperinflation, suggesting COPD.  No acute findings. Electronically Signed   By: Jeronimo Greaves M.D.   On: 09/30/2020 14:56   EEG adult  Result Date: 09/30/2020 Charlsie Quest, MD  09/30/2020  6:51 PM Patient Name: KENNIS BUELL MRN: [id number] Epilepsy Attending: Charlsie Quest Referring Physician/Provider: Jimmye Norman, NP Date: 09/30/2020 Duration: 23.19 mins Patient history: 60yo F with seizure like activity. EEG to evaluate for seizure Level of alertness:  lethargic AEDs during EEG study: LEV Technical aspects: This EEG study was done with scalp electrodes  positioned according to the 10-20 International system of electrode placement. Electrical activity was acquired at a sampling rate of 500Hz  and reviewed with a high frequency filter of 70Hz  and a low frequency filter of 1Hz . EEG data were recorded continuously and digitally stored. Description: No posterior dominant rhythm was seen. EEG showed continuous generalized polymorphic and lateralized right hemisphere 3 to 6 Hz theta-delta slowing admixed with excessive amount of 15 to 18 Hz beta activity distributed symmetrically and diffusely. Hyperventilation and photic stimulation were not performed.   ABNORMALITY - Continuous slow, generalized and lateralized right hemisphere - Excessive beta, generalized IMPRESSION: This study is suggestive of ortical dysfunction arising from right hemisphere likely secondary to underlying structural abnormality. There is also moderate to severe diffuse encephalopathy, nonspecific etiology, No seizures or epileptiform discharges were seen throughout the recording. Priyanka   Overnight EEG with video  Result Date: 10/01/2020 , MD     10/01/2020  4:53 PM Patient Name: DANETTE WEINFELD MRN: Charlsie Quest Epilepsy Attending: 10/03/2020 Referring Physician/Provider: Dr Lelon Perla Duration: 09/30/2020 1737 to 10/01/2020 1640  Patient history: 60yo F with seizure like activity. EEG to evaluate for seizure  Level of alertness:  lethargic  AEDs during EEG study: LEV  Technical aspects: This EEG study was done with scalp electrodes positioned according to the 10-20 International system of electrode placement. Electrical activity was acquired at a sampling rate of 500Hz  and reviewed with a high frequency filter of 70Hz  and a low frequency filter of 1Hz . EEG data were recorded continuously and digitally stored.  Description: The posterior dominant rhythm consists of 9 Hz activity of moderate voltage (25-35 uV) seen predominantly in posterior head regions, symmetric  and reactive to eye opening and eye closing. Sleep was characterized by vertex waves, sleep spindles (12 to 14 Hz), asymmetric left frontocentral region. EEG showed continuous generalized and lateralized right hemisphere 3 to 6 Hz theta-delta slowing which at times appeared rhythmic without definite evolution. Hyperventilation and photic stimulation were not performed.    ABNORMALITY - Continuous slow, generalized and lateralized right hemisphere - Spindle asymmetry, right<left  IMPRESSION: This study is suggestive of cortical dysfunction arising from right hemisphere likely secondary to underlying structural abnormality. There is also moderate diffuse encephalopathy, nonspecific etiology, No seizures or epileptiform discharges were seen throughout the recording.  Bing Neighbors   ECHOCARDIOGRAM COMPLETE  Result Date: 10/01/2020    ECHOCARDIOGRAM REPORT   Patient Name:   KERRIANNE JENG [facility] Date of Exam: 10/01/2020 Medical Rec #:          Height:       60.0 in Accession #:    Charlsie Quest        Weight:       80.7 lb Date of Birth:  1961/04/01         BSA:          1.269 m Patient Age:    60 years          BP:           107/71 mmHg Patient Gender: F  HR:           71 bpm. Exam Location:  Inpatient Procedure: 2D Echo, Cardiac Doppler and Color Doppler Indications:    Other cardiac sounds R01.2  History:        Patient has no prior history of Echocardiogram examinations.                 Risk Factors:Hypertension.  Sonographer:    Eulah Pont RDCS Referring Phys: [id number] WHITNEY F DAVIS IMPRESSIONS  1. Left ventricular ejection fraction, by estimation, is 65 to 70%. The left ventricle has normal function. The left ventricle has no regional wall motion abnormalities. Left ventricular diastolic parameters were normal.  2. Right ventricular systolic function is normal. The right ventricular size is normal.  3. The mitral valve is normal in structure. Trivial mitral valve regurgitation. No  evidence of mitral stenosis.  4. The aortic valve was not well visualized. Aortic valve regurgitation is not visualized. No aortic stenosis is present. FINDINGS  Left Ventricle: Left ventricular ejection fraction, by estimation, is 65 to 70%. The left ventricle has normal function. The left ventricle has no regional wall motion abnormalities. The left ventricular internal cavity size was small. There is no left ventricular hypertrophy. Left ventricular diastolic parameters were normal. Right Ventricle: The right ventricular size is normal. No increase in right ventricular wall thickness. Right ventricular systolic function is normal. Left Atrium: Left atrial size was normal in size. Right Atrium: Right atrial size was normal in size. Pericardium: There is no evidence of pericardial effusion. Mitral Valve: The mitral valve is normal in structure. Trivial mitral valve regurgitation. No evidence of mitral valve stenosis. Tricuspid Valve: The tricuspid valve is normal in structure. Tricuspid valve regurgitation is trivial. Aortic Valve: The aortic valve was not well visualized. Aortic valve regurgitation is not visualized. No aortic stenosis is present. Pulmonic Valve: The pulmonic valve was not well visualized. Pulmonic valve regurgitation is not visualized. Aorta: The aortic root and ascending aorta are structurally normal, with no evidence of dilitation. IAS/Shunts: The interatrial septum was not well visualized.  LEFT VENTRICLE PLAX 2D LVIDd:         3.40 cm  Diastology LVIDs:         2.20 cm  LV e' medial:    10.40 cm/s LV PW:         0.80 cm  LV E/e' medial:  7.5 LV IVS:        0.80 cm  LV e' lateral:   11.40 cm/s LVOT diam:     2.00 cm  LV E/e' lateral: 6.8 LV SV:         59 LV SV Index:   47 LVOT Area:     3.14 cm  RIGHT VENTRICLE RV S prime:     11.70 cm/s TAPSE (M-mode): 1.8 cm LEFT ATRIUM             Index       RIGHT ATRIUM           Index LA diam:        1.90 cm 1.50 cm/m  RA Area:     12.20 cm LA Vol  (A2C):   34.3 ml 27.03 ml/m RA Volume:   23.90 ml  18.83 ml/m LA Vol (A4C):   33.4 ml 26.32 ml/m LA Biplane Vol: 34.0 ml 26.79 ml/m  AORTIC VALVE LVOT Vmax:   94.00 cm/s LVOT Vmean:  65.300 cm/s LVOT VTI:    0.189 m  AORTA Ao  Root diam: 3.00 cm Ao Asc diam:  3.00 cm MITRAL VALVE MV Area (PHT): 2.75 cm    SHUNTS MV Decel Time: 276 msec    Systemic VTI:  0.19 m MV E velocity: 78.00 cm/s  Systemic Diam: 2.00 cm MV A velocity: 53.60 cm/s MV E/A ratio:  1.46 Epifanio Lescheshristopher Schumann MD Electronically signed by Epifanio Lescheshristopher Schumann MD Signature Date/Time: 10/01/2020/10:27:00 AM    Final     Disposition:  Hospice  Discharge disposition: 51-Hospice/Medical Facility      Discharge Instructions     Diet general   Complete by: As directed    No wound care   Complete by: As directed        Allergies as of 10/02/2020       Reactions   Penicillins         Medication List     STOP taking these medications    atorvastatin 40 MG tablet Commonly known as: LIPITOR   furosemide 40 MG tablet Commonly known as: LASIX   lactulose 10 GM/15ML solution Commonly known as: CHRONULAC   levETIRAcetam 750 MG tablet Commonly known as: KEPPRA   levothyroxine 25 MCG tablet Commonly known as: SYNTHROID   potassium chloride SA 20 MEQ tablet Commonly known as: KLOR-CON   primidone 50 MG tablet Commonly known as: MYSOLINE   propranolol 10 MG tablet Commonly known as: INDERAL   spironolactone 50 MG tablet Commonly known as: Aldactone       TAKE these medications    levETIRAcetam 1000 MG/100ML Soln Commonly known as: KEPPRA Inject 100 mLs (1,000 mg total) into the vein every 12 (twelve) hours.         Follow-up appointment    Discharge Condition:   Poor  [place] Statement:   The Patient was personally examined, the discharge assessment and plan has been personally reviewed and I agree with Ardys Hataway's assessment and plan. 30 minutes of time have been dedicated to discharge  assessment, planning and discharge instructions.   Signed:  Gershon Musselimothy Scott Liel Rudden, Jr., MSN, APRN, AGACNP-BC [facility]  10/02/2020 , 1:54 PM  Please see [email or web address] for pager details  If no response, please call [phone] After hours, please call Elink at [phone]

## 2020-10-02 NOTE — TOC Transition Note (Signed)
Transition of Care [facility]) - CM/SW Discharge Note   Patient Details  Name: Ann Wall MRN: [id number] Date of Birth: 06/28/1960  [facility] CM/SW Contact:  Baldemar Lenis, LCSW Phone Number: 10/02/2020, 1:21 PM   Clinical Narrative:   Nurse to call report to [phone].  Please call patient's spouse when patient is picked up.    Final next level of care: Hospice Medical Facility Barriers to Discharge: Barriers Resolved   Patient Goals and CMS Choice Patient states their goals for this hospitalization and ongoing recovery are:: patient unable to participate in goal setting CMS Medicare.gov Compare Post Acute Care list provided to:: Patient Represenative (must comment) Choice offered to / list presented to : Spouse  Discharge Placement                Patient to be transferred to facility by: PTAR Name of family member notified: Alinda Money Patient and family notified of of transfer: 10/02/20  Discharge Plan and Services     Post Acute Care Choice: Hospice                               Social Determinants of Health (SDOH) Interventions     Readmission Risk Interventions No flowsheet data found.

## 2020-10-02 NOTE — Progress Notes (Signed)
Nutrition Brief Note  Chart reviewed. Pt now comfort care only, NPO, TF discontinued. No further nutrition interventions planned at this time.  Please re-consult as needed.   Romelle Starcher MS, RDN, LDN, CNSC Registered Dietitian III Clinical Nutrition RD Pager and On-Call Pager Number Located in [place]

## 2020-10-05 LAB — CULTURE, BLOOD (ROUTINE X 2)
Culture: NO GROWTH
Culture: NO GROWTH
Special Requests: ADEQUATE

## 2020-10-09 ENCOUNTER — Telehealth (HOSPITAL_COMMUNITY): Payer: Self-pay | Admitting: Radiology

## 2020-10-09 NOTE — Telephone Encounter (Addendum)
Spoke to patient's husband. Patient has been moved to Hospice care and will not need this surgery. Will cancel procedure.   Called pt, left VM for her to call me back. Scheduling TIPS for 6/28 with [facility]. JM

## 2020-10-16 ENCOUNTER — Inpatient Hospital Stay: Admit: 2020-10-16 | Payer: Medicaid Other

## 2020-10-16 SURGERY — IR WITH ANESTHESIA
Anesthesia: General

## 2020-11-03 ENCOUNTER — Other Ambulatory Visit: Payer: Self-pay | Admitting: Gastroenterology

## 2020-11-03 DIAGNOSIS — K7460 Unspecified cirrhosis of liver: Secondary | ICD-10-CM

## 2021-01-19 DEATH — deceased

## 2021-03-18 ENCOUNTER — Other Ambulatory Visit: Payer: Self-pay | Admitting: Gastroenterology

## 2021-03-18 DIAGNOSIS — K7460 Unspecified cirrhosis of liver: Secondary | ICD-10-CM

## 2021-08-14 IMAGING — DX DG CHEST 1V PORT
1 series · 1 of 1 positions shown · non-contrast
Comparison: Earlier today, [DATE] a.m., from [REDACTED].

CLINICAL DATA: End-stage liver disease.  Seizures.  Transfer.

EXAM:
PORTABLE CHEST 1 VIEW

[chest]
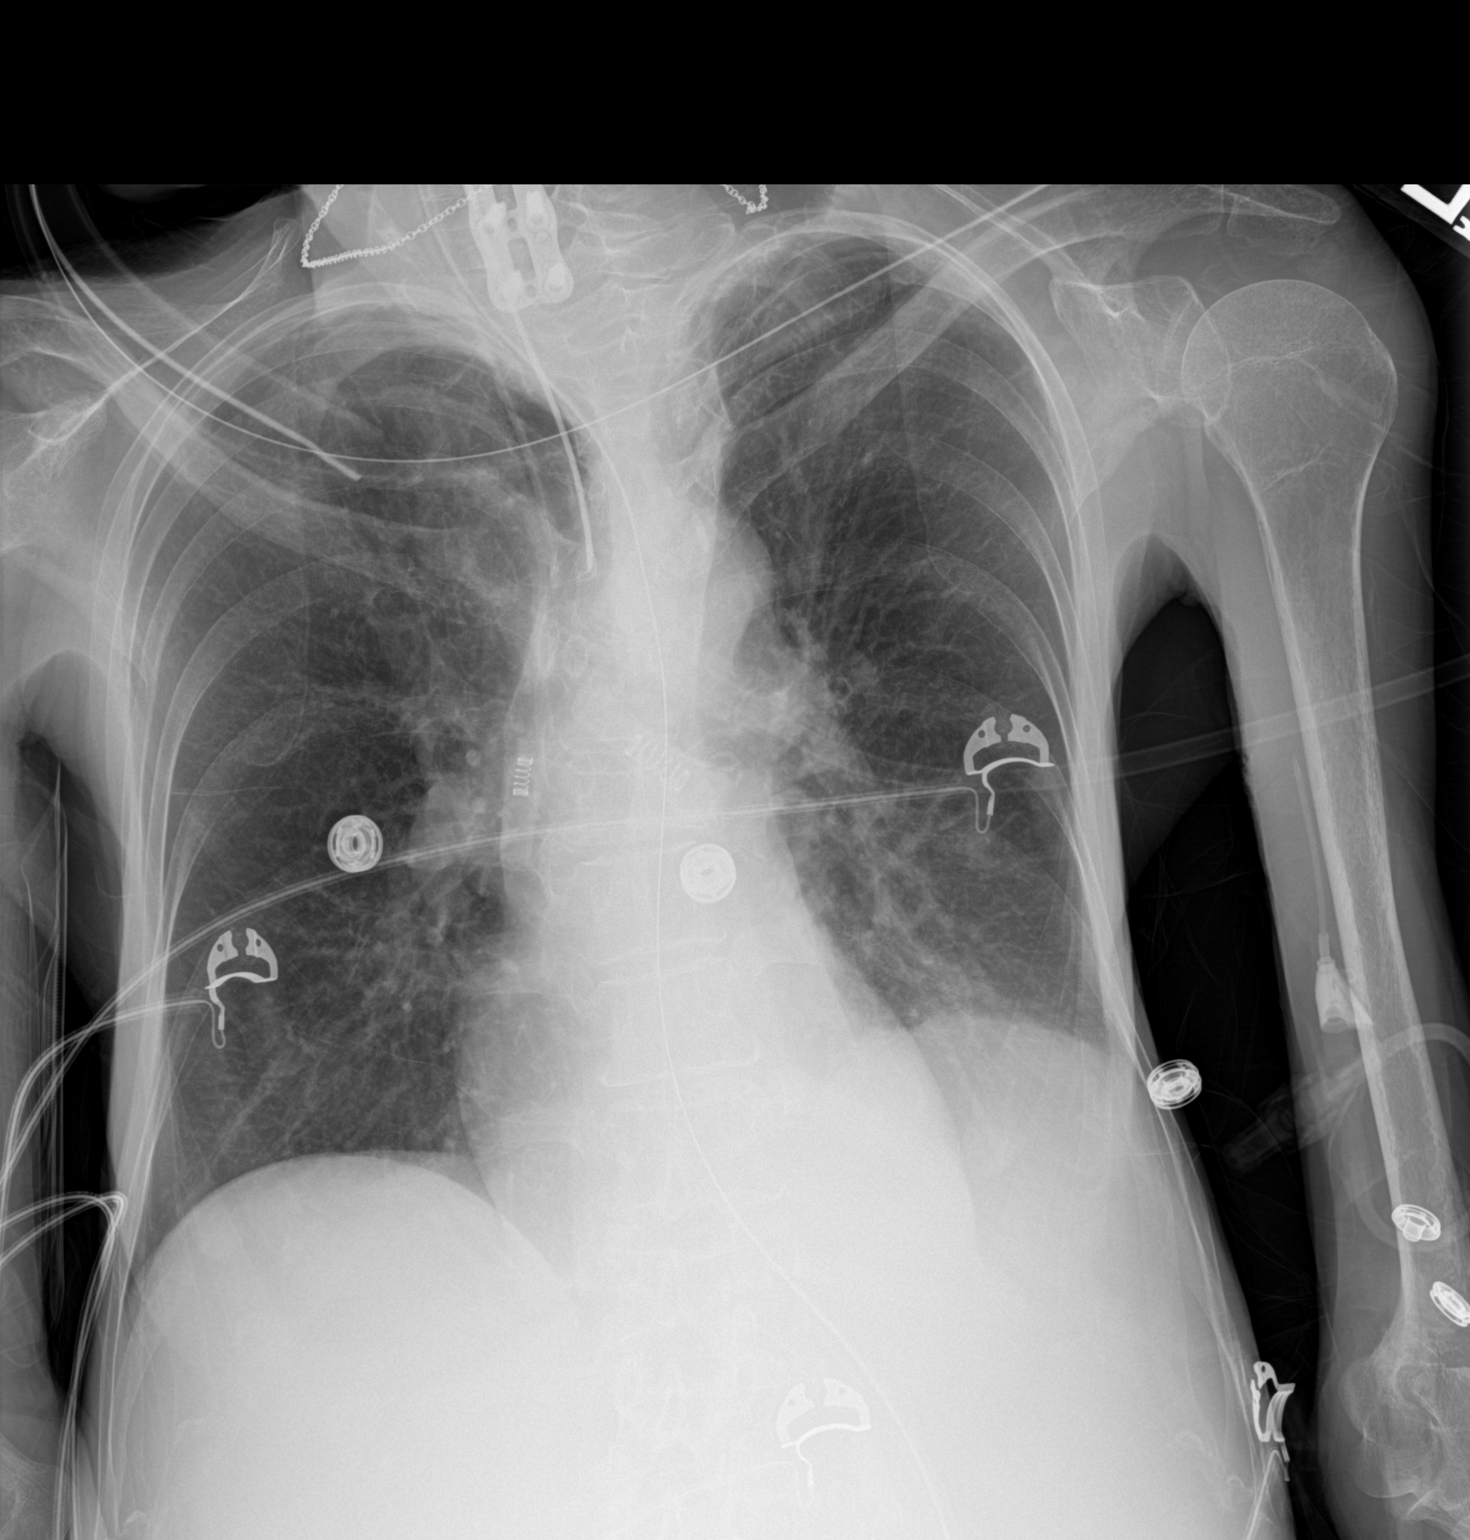

[1 of 1 positions shown; findings below may reference images not displayed]

FINDINGS: [DATE] p.m. Patient rotated to the right. Endotracheal tube
terminates 1.9 cm above carina. Nasogastric tube extends beyond the
inferior aspect of the film. Normal heart size. No pleural effusion
or pneumothorax. Hyperinflation. No lobar consolidation. Mild left
hemidiaphragm elevation.
IMPRESSION: Appropriate and stable position of support apparatus.

Hyperinflation, suggesting COPD.  No acute findings.
